# Patient Record
Sex: Female | Born: 1990 | Race: Black or African American | Hispanic: No | Marital: Single | Smoking: Never smoker
Health system: Southern US, Community
[De-identification: ages and names within clinical notes are randomized; demographics above are authoritative.]

## PROBLEM LIST (undated history)

## (undated) DIAGNOSIS — I10 Essential (primary) hypertension: Secondary | ICD-10-CM

## (undated) DIAGNOSIS — Z789 Other specified health status: Secondary | ICD-10-CM

## (undated) HISTORY — PX: WISDOM TOOTH EXTRACTION: SHX21

## (undated) HISTORY — DX: Essential (primary) hypertension: I10

---

## 2015-01-05 ENCOUNTER — Ambulatory Visit (INDEPENDENT_AMBULATORY_CARE_PROVIDER_SITE_OTHER): Payer: BLUE CROSS/BLUE SHIELD | Admitting: Internal Medicine

## 2015-01-05 VITALS — BP 130/82 | HR 110 | Temp 98.6°F | Resp 18 | Ht 68.5 in | Wt 344.0 lb

## 2015-01-05 DIAGNOSIS — Z6841 Body Mass Index (BMI) 40.0 and over, adult: Secondary | ICD-10-CM | POA: Insufficient documentation

## 2015-01-05 DIAGNOSIS — J029 Acute pharyngitis, unspecified: Secondary | ICD-10-CM | POA: Diagnosis not present

## 2015-01-05 DIAGNOSIS — J039 Acute tonsillitis, unspecified: Secondary | ICD-10-CM | POA: Diagnosis not present

## 2015-01-05 LAB — POCT RAPID STREP A (OFFICE): Rapid Strep A Screen: NEGATIVE

## 2015-01-05 MED ORDER — AMOXICILLIN 500 MG PO CAPS: 1000.0000 mg | ORAL_CAPSULE | Freq: Two times a day (BID) | ORAL | Status: AC

## 2015-01-05 MED ORDER — PREDNISONE 20 MG PO TABS: ORAL_TABLET | ORAL | Status: DC

## 2015-01-05 NOTE — Progress Notes (Signed)
   Subjective:  This chart was scribed for Susan Sia, MD by [facility], medical scribe at [facility].The patient was seen in exam room 10 and the patient's care was started at 10:46 AM.   Patient ID: Susan Sellers, female    DOB: Dec 29, 1990, 24 y.o.   MRN: [id number] Chief Complaint  Patient presents with  . Sore Throat    x1 day  . Adenopathy    rt side    HPI HPI Comments: Susan Sellers is a 24 y.o. female who presents to [facility] complaining of sore throat for the past two days, worse yesterday. Swollen right sided lymph node. Associated left ear pain and post nasal drip. She does not have a cough, fever, or chills. No history of mono. Works call ctr History reviewed. No pertinent past medical history. Prior to Admission medications   Not on File   No Known Allergies  Review of Systems  Constitutional: Negative for fever and chills.  HENT: Positive for ear pain, postnasal drip and sore throat.   Respiratory: Negative for cough.       Objective:  BP 130/82 mmHg  Pulse 110  Temp(Src) 98.6 F (37 C) (Oral)  Resp 18  Ht 5' 8.5" (1.74 m)  Wt 344 lb (156.037 kg)  BMI 51.54 kg/m2  SpO2 98%  LMP 11/19/2014 Physical Exam  Constitutional: She is oriented to person, place, and time. She appears well-developed and well-nourished. No distress.  Voice muffled mildly  HENT:  Head: Normocephalic and atraumatic.  Right Ear: External ear normal.  Left Ear: External ear normal.  Tonsils 3+ and covered w/ exudate R ac node 1+ and tender  Eyes: Pupils are equal, round, and reactive to light.  Neck: Normal range of motion.  Cardiovascular: Normal rate, regular rhythm and normal heart sounds.   Pulmonary/Chest: Effort normal and breath sounds normal. No respiratory distress. She has no wheezes.  Musculoskeletal: Normal range of motion.  Neurological: She is alert and oriented to person, place, and time.  Skin: Skin is warm and  dry.  Psychiatric: She has a normal mood and affect. Her behavior is normal.  Nursing note and vitals reviewed.  RS neg    Assessment & Plan:  Acute tonsillitis  Sore throat - Plan: POCT rapid strep A, Culture, Group A Strep  Meds ordered this encounter  Medications  . amoxicillin (AMOXIL) 500 MG capsule    Sig: Take 2 capsules (1,000 mg total) by mouth 2 (two) times daily.    Dispense:  40 capsule    Refill:  0  . predniSONE (DELTASONE) 20 MG tablet    Sig: 3/3/2/2/1/1 single daily dose for 6 days    Dispense:  12 tablet    Refill:  0   Treat as an early peritonsillar abscess and follow closely OOW 2 days   I have completed the patient encounter in its entirety as documented by the scribe, with editing by me where necessary. Hollyanne Schloesser P. Merla Riches, M.D.

## 2015-01-06 LAB — CULTURE, GROUP A STREP: ORGANISM ID, BACTERIA: NORMAL

## 2015-01-10 ENCOUNTER — Encounter: Payer: Self-pay | Admitting: Family Medicine

## 2015-01-12 ENCOUNTER — Telehealth: Payer: Self-pay | Admitting: Family Medicine

## 2015-01-12 NOTE — Telephone Encounter (Signed)
Spoke with patient about lab results she doing much better she start feeling better as soon as she took medicine

## 2019-11-12 ENCOUNTER — Inpatient Hospital Stay (HOSPITAL_COMMUNITY): Payer: BC Managed Care – PPO

## 2019-11-12 ENCOUNTER — Other Ambulatory Visit: Payer: Self-pay

## 2019-11-12 ENCOUNTER — Encounter (HOSPITAL_COMMUNITY): Payer: Self-pay | Admitting: Obstetrics and Gynecology

## 2019-11-12 ENCOUNTER — Inpatient Hospital Stay (HOSPITAL_COMMUNITY)
Admission: AD | Admit: 2019-11-12 | Discharge: 2019-11-12 | Disposition: A | Discharge status: Home / Self Care | Payer: BC Managed Care – PPO | Attending: Obstetrics and Gynecology | Admitting: Obstetrics and Gynecology

## 2019-11-12 DIAGNOSIS — O3680X0 Pregnancy with inconclusive fetal viability, not applicable or unspecified: Secondary | ICD-10-CM | POA: Insufficient documentation

## 2019-11-12 DIAGNOSIS — O4691 Antepartum hemorrhage, unspecified, first trimester: Secondary | ICD-10-CM

## 2019-11-12 DIAGNOSIS — O209 Hemorrhage in early pregnancy, unspecified: Secondary | ICD-10-CM | POA: Insufficient documentation

## 2019-11-12 DIAGNOSIS — Z3A01 Less than 8 weeks gestation of pregnancy: Secondary | ICD-10-CM

## 2019-11-12 DIAGNOSIS — O26891 Other specified pregnancy related conditions, first trimester: Secondary | ICD-10-CM | POA: Diagnosis present

## 2019-11-12 DIAGNOSIS — R109 Unspecified abdominal pain: Secondary | ICD-10-CM | POA: Diagnosis not present

## 2019-11-12 DIAGNOSIS — Z6790 Unspecified blood type, Rh positive: Secondary | ICD-10-CM | POA: Insufficient documentation

## 2019-11-12 DIAGNOSIS — O4690 Antepartum hemorrhage, unspecified, unspecified trimester: Secondary | ICD-10-CM

## 2019-11-12 LAB — POCT PREGNANCY, URINE: Preg Test, Ur: POSITIVE — AB

## 2019-11-12 LAB — WET PREP, GENITAL
Clue Cells Wet Prep HPF POC: NONE SEEN
Sperm: NONE SEEN
Trich, Wet Prep: NONE SEEN
Yeast Wet Prep HPF POC: NONE SEEN

## 2019-11-12 LAB — CBC
HCT: 37 % (ref 36.0–46.0)
Hemoglobin: 11.4 g/dL — ABNORMAL LOW (ref 12.0–15.0)
MCH: 25.7 pg — ABNORMAL LOW (ref 26.0–34.0)
MCHC: 30.8 g/dL (ref 30.0–36.0)
MCV: 83.3 fL (ref 80.0–100.0)
Platelets: 300 10*3/uL (ref 150–400)
RBC: 4.44 MIL/uL (ref 3.87–5.11)
RDW: 14.5 % (ref 11.5–15.5)
WBC: 12.3 10*3/uL — ABNORMAL HIGH (ref 4.0–10.5)
nRBC: 0 % (ref 0.0–0.2)

## 2019-11-12 LAB — HCG, QUANTITATIVE, PREGNANCY: hCG, Beta Chain, Quant, S: 1160 m[IU]/mL — ABNORMAL HIGH (ref <5)

## 2019-11-12 LAB — ABO/RH: ABO/RH(D): B POS

## 2019-11-12 NOTE — MAU Provider Note (Signed)
History     CSN: [id number]  Arrival date and time: 11/12/19 1050   First Provider Initiated Contact with Patient 11/12/19 1242      Chief Complaint  Patient presents with  . Vaginal Bleeding  . Abdominal Pain   29 y.o. G1 @[redacted]w[redacted]d  by LMP presenting with VB. Bleeding started yesterday. She soaked 1 pad and passed a large clot with tissue then bleeding tapered and has been minimal. Cramping is mild today. She has not had this pregnancy.   OB History    Gravida  1   Para  0   Term  0   Preterm  0   AB  0   Living  0     SAB  0   TAB  0   Ectopic  0   Multiple  0   Live Births  0           History reviewed. No pertinent past medical history.  History reviewed. No pertinent surgical history.  Family History  Problem Relation Age of Onset  . Heart disease Mother   . Hypertension Mother   . Hypertension Father   . Hypertension Maternal Grandmother   . Hyperlipidemia Paternal Grandmother     Social History   Tobacco Use  . Smoking status: Never Smoker  . Smokeless tobacco: Never Used  Substance Use Topics  . Alcohol use: Not Currently    Alcohol/week: 0.0 standard drinks  . Drug use: Not Currently    Types: Marijuana    Comment: last used in early 2021    Allergies: No Known Allergies  Medications Prior to Admission  Medication Sig Dispense Refill Last Dose  . Prenatal Vit-Fe Fumarate-FA (MULTIVITAMIN-PRENATAL) 27-0.8 MG TABS tablet Take 1 tablet by mouth daily at 12 noon.     . predniSONE (DELTASONE) 20 MG tablet 3/3/2/2/1/1 single daily dose for 6 days 12 tablet 0     Review of Systems  Gastrointestinal: Positive for abdominal pain.  Genitourinary: Positive for vaginal bleeding.   Physical Exam   Blood pressure 140/86, pulse 82, temperature 98.3 F (36.8 C), temperature source Oral, resp. rate 17, height 5\' 8"  (1.727 m), weight 132 kg, last menstrual period 09/30/2019, SpO2 100 %.  Physical Exam Vitals and nursing note reviewed.  Exam conducted with a chaperone present.  Constitutional:      General: She is not in acute distress.    Appearance: She is well-developed.  HENT:     Head: Normocephalic and atraumatic.  Cardiovascular:     Rate and Rhythm: Normal rate.  Pulmonary:     Effort: Pulmonary effort is normal. No respiratory distress.  Abdominal:     General: There is no distension.     Palpations: Abdomen is soft.     Tenderness: There is no abdominal tenderness.  Genitourinary:    Comments: External: no lesions or erythema Vagina: rugated, pink, moist, ?POCs protruding from os, removed easily with ring forceps, scant blood in vault Cervix closed  Skin:    General: Skin is warm and dry.  Neurological:     General: No focal deficit present.     Mental Status: She is alert and oriented to person, place, and time.  Psychiatric:        Mood and Affect: Mood normal.    Results for orders placed or performed during the hospital encounter of 11/12/19 (from the past 24 hour(s))  Pregnancy, urine POC     Status: Abnormal   Collection Time: 11/12/19 11:43  AM  Result Value Ref Range   Preg Test, Ur POSITIVE (A) NEGATIVE  ABO/Rh     Status: None   Collection Time: 11/12/19 12:32 PM  Result Value Ref Range   ABO/RH(D) B POS    No rh immune globuloin      NOT A RH IMMUNE GLOBULIN CANDIDATE, PT RH POSITIVE Performed at [facility] [address] [place], [place] [address]   CBC     Status: Abnormal   Collection Time: 11/12/19 12:32 PM  Result Value Ref Range   WBC 12.3 (H) 4.0 - 10.5 K/uL   RBC 4.44 3.87 - 5.11 MIL/uL   Hemoglobin 11.4 (L) 12.0 - 15.0 g/dL   HCT 37.0 36 - 46 %   MCV 83.3 80.0 - 100.0 fL   MCH 25.7 (L) 26.0 - 34.0 pg   MCHC 30.8 30.0 - 36.0 g/dL   RDW 14.5 11.5 - 15.5 %   Platelets 300 150 - 400 K/uL   nRBC 0.0 0.0 - 0.2 %  hCG, quantitative, pregnancy     Status: Abnormal   Collection Time: 11/12/19 12:32 PM  Result Value Ref Range   hCG, Beta Chain, Quant, S 1,160 (H)  <5 mIU/mL  Wet prep, genital     Status: Abnormal   Collection Time: 11/12/19 12:55 PM   Specimen: PATH Cytology Cervicovaginal Ancillary Only  Result Value Ref Range   Yeast Wet Prep HPF POC NONE SEEN NONE SEEN   Trich, Wet Prep NONE SEEN NONE SEEN   Clue Cells Wet Prep HPF POC NONE SEEN NONE SEEN   WBC, Wet Prep HPF POC MANY (A) NONE SEEN   Sperm NONE SEEN    US OB LESS THAN 14 WEEKS WITH OB TRANSVAGINAL  Result Date: 11/12/2019 CLINICAL DATA:  Positive urine pregnancy test. Vaginal bleeding. Estimated gestational age by last menstrual period equals 6 weeks 1 day EXAM: OBSTETRIC <14 WK [place] AND TRANSVAGINAL OB US TECHNIQUE: Both transabdominal and transvaginal ultrasound examinations were performed for complete evaluation of the gestation as well as the maternal uterus, adnexal regions, and pelvic cul-de-sac. Transvaginal technique was performed to assess early pregnancy. COMPARISON:  None. FINDINGS: Intrauterine gestational sac: Absent Yolk sac:  Absent Embryo:  Absent Subchorionic hemorrhage:  None visualized. Maternal uterus/adnexae: The thick-walled cystic structure measuring 2 cm within the RIGHT ovary most consistent with corpus luteal cyst. Normal LEFT ovary. Endometrium is thickened. No free fluid. IMPRESSION: Probable early intrauterine gestational sac, but no yolk sac, fetal pole, or cardiac activity yet visualized. Recommend follow-up quantitative B-HCG levels and follow-up US in 14 days to assess viability. This recommendation follows SRU consensus guidelines: Diagnostic Criteria for Nonviable Pregnancy Early in the First Trimester. [place] Med 2013; 993:7169-67. Electronically Signed   By: Suzy Bouchard M.D.   On: 11/12/2019 14:11   MAU Course  Procedures  MDM Labs and [place] ordered and reviewed. No IUP seen on [place], suspect miscarriage but cannot r/o early pregnancy or ectopic pregnancy. Discussed findings with pt and mother. Recommend following qhcg in 2 days at office. Tissue sent  to path. Stable for discharge home.   Assessment and Plan   1. Pregnancy, location unknown   2. Vaginal bleeding in pregnancy   3. Blood type, Rh positive    Discharge home Follow up at [facility] in 2 days-office staff [place] notified Bleeding/ectopic precautions Pelvic rest  Allergies as of 11/12/2019   No Known Allergies     Medication List  STOP taking these medications   predniSONE 20 MG tablet Commonly known as: DELTASONE     TAKE these medications   multivitamin-prenatal 27-0.8 MG Tabs tablet Take 1 tablet by mouth daily at 12 noon.      Donette Larry, CNM 11/12/2019, 2:48 PM

## 2019-11-12 NOTE — Discharge Instructions (Signed)
Threatened Miscarriage  A threatened miscarriage is when you have bleeding from your vagina during the first 20 weeks of pregnancy but the pregnancy does not end. Your doctor will do tests to make sure you are still pregnant. The cause of the bleeding may not be known. This condition does not mean your pregnancy will end, but it does increase the risk that it will end (complete miscarriage). Follow these instructions at home:  Get plenty of rest.  If you have bleeding in your vagina, do not have sex or use tampons.  Do not douche.  Do not smoke or use drugs.  Do not drink alcohol.  Avoid caffeine.  Keep all follow-up prenatal visits as told by your doctor. This is important. Contact a doctor if:  You have light bleeding from your vagina.  You have belly pain or cramping.  You have a fever. Get help right away if:  You have heavy bleeding from your vagina.  You have clots of blood coming from your vagina.  You pass tissue from your vagina.  You have a gush of fluid from your vagina.  You are leaking fluid from your vagina.  You have very bad pain or cramps in your low back or belly.  You have fever, chills, and very bad belly pain. Summary  A threatened miscarriage is when you have bleeding from your vagina during the first 20 weeks of pregnancy but the pregnancy does not end.  This condition does not mean your pregnancy will end, but it does increase the risk that it will end (complete miscarriage).  Get plenty of rest. If you have bleeding in your vagina, do not have sex or use tampons.  Keep all follow-up prenatal visits as told by your doctor. This is important. This information is not intended to replace advice given to you by your health care provider. Make sure you discuss any questions you have with your health care provider. Document Revised: 06/09/2017 Document Reviewed: 07/30/2016 Elsevier Patient Education  2020 Elsevier Inc.  

## 2019-11-12 NOTE — MAU Note (Signed)
Was 6 wks on Sat. Has appt for 7/6.  Had some blood and tissue yesterday, still bleeding a little, not as heavy as yesterday. Some discomfort, little cramping.  3 +HPT, the last one had a fainter line.

## 2019-11-13 LAB — GC/CHLAMYDIA PROBE AMP (~~LOC~~) NOT AT ARMC
Chlamydia: NEGATIVE
Comment: NEGATIVE
Comment: NORMAL
Neisseria Gonorrhea: NEGATIVE

## 2019-11-14 LAB — SURGICAL PATHOLOGY

## 2019-11-20 DIAGNOSIS — O3680X0 Pregnancy with inconclusive fetal viability, not applicable or unspecified: Secondary | ICD-10-CM | POA: Diagnosis not present

## 2019-11-20 DIAGNOSIS — O021 Missed abortion: Secondary | ICD-10-CM | POA: Diagnosis not present

## 2019-11-29 DIAGNOSIS — O021 Missed abortion: Secondary | ICD-10-CM | POA: Diagnosis not present

## 2020-10-01 DIAGNOSIS — K648 Other hemorrhoids: Secondary | ICD-10-CM | POA: Diagnosis not present

## 2020-10-27 ENCOUNTER — Inpatient Hospital Stay (HOSPITAL_COMMUNITY)
Admission: AD | Admit: 2020-10-27 | Discharge: 2020-10-27 | Disposition: A | Discharge status: Home / Self Care | Payer: BC Managed Care – PPO | Attending: Obstetrics & Gynecology | Admitting: Obstetrics & Gynecology

## 2020-10-27 ENCOUNTER — Telehealth: Payer: Self-pay | Admitting: Women's Health

## 2020-10-27 ENCOUNTER — Other Ambulatory Visit: Payer: Self-pay

## 2020-10-27 DIAGNOSIS — Z3202 Encounter for pregnancy test, result negative: Secondary | ICD-10-CM | POA: Diagnosis not present

## 2020-10-27 DIAGNOSIS — Z6790 Unspecified blood type, Rh positive: Secondary | ICD-10-CM

## 2020-10-27 LAB — HCG, QUANTITATIVE, PREGNANCY: hCG, Beta Chain, Quant, S: 8 m[IU]/mL — ABNORMAL HIGH (ref <5)

## 2020-10-27 LAB — POCT PREGNANCY, URINE: Preg Test, Ur: NEGATIVE

## 2020-10-27 NOTE — Discharge Instructions (Signed)
Prenatal Care Providers           [facility] - accepts patients without insurance  Phone: [phone]  [facility] @ [facility]   Phone: [phone]  [facility] @[facility]       Phone: [phone]            [facility] @      Phone: [phone]          [facility] @ [place]   Phone: [phone]  [facility] @ [facility] - accepts patients without insurance  Phone: [phone]  [facility] @ [facility] Phone: [phone]     [facility] - accepts patients without insurance Phone: [phone]  [facility]  Phone: [phone]  [facility] Phone: [phone]  [facility] Phone: [phone]  [facility] Phone: [phone]  [facility] Phone: [phone]  [facility]  Phone: [phone]         

## 2020-10-27 NOTE — MAU Note (Signed)
Having some spotting, started yesterday.  Mainly when she wipes.Believes she is 5 wks.  Hx of miscarriage, very tearful and scared.  Denies pain.  Preg has not been confirmed, +HPT x2 6/6.

## 2020-10-27 NOTE — Telephone Encounter (Signed)
Phone rang, no answer, went to VM, VM full and unable to leave messages. Will send MyChart message.  Marylen Ponto, NP  4:07 PM 10/27/2020

## 2020-10-27 NOTE — MAU Provider Note (Signed)
Event Date/Time   First Provider Initiated Contact with Patient 10/27/20 1400      S Ms. Susan Sellers is a 30 y.o. G1P0000 patient who presents to MAU today with complaint of +UPT at home x2. Patient endorses some mild vaginal bleeding with a small clot this morning and reports her period is late. Patient reports she feels well otherwise.  O BP (!) 147/86 (BP Location: Right Arm)   Pulse 70   Temp 98.6 F (37 C) (Oral)   Resp 18   Ht 5\' 8"  (1.727 m)   Wt 127.1 kg   LMP 09/18/2020   SpO2 98%   BMI 42.59 kg/m   Physical Exam Vitals and nursing note reviewed.  Constitutional:      General: She is not in acute distress.    Appearance: Normal appearance. She is not ill-appearing, toxic-appearing or diaphoretic.  HENT:     Head: Normocephalic and atraumatic.  Pulmonary:     Effort: Pulmonary effort is normal.  Neurological:     Mental Status: She is alert and oriented to person, place, and time.  Psychiatric:        Mood and Affect: Mood normal.        Behavior: Behavior normal.        Thought Content: Thought content normal.        Judgment: Judgment normal.   A Medical screening exam complete UPT negative hCG drawn, will call pt with results  P Discharge from MAU in stable condition Patient given the option of transfer to [facility] for further evaluation or seek care in outpatient facility of choice List of options for follow-up given in MyChart Pt to follow-up with PCP re: elevated BP Warning signs for worsening condition that would warrant emergency follow-up discussed Patient may return to MAU as needed   Jaxtyn Linville, [facility], NP 10/27/2020 2:01 PM

## 2020-10-29 ENCOUNTER — Encounter: Payer: Self-pay | Admitting: *Deleted

## 2020-10-29 ENCOUNTER — Other Ambulatory Visit (INDEPENDENT_AMBULATORY_CARE_PROVIDER_SITE_OTHER): Payer: BC Managed Care – PPO | Admitting: *Deleted

## 2020-10-29 ENCOUNTER — Other Ambulatory Visit: Payer: Self-pay

## 2020-10-29 ENCOUNTER — Telehealth: Payer: Self-pay | Admitting: *Deleted

## 2020-10-29 DIAGNOSIS — O4690 Antepartum hemorrhage, unspecified, unspecified trimester: Secondary | ICD-10-CM | POA: Diagnosis not present

## 2020-10-29 LAB — BETA HCG QUANT (REF LAB): hCG Quant: 2 m[IU]/mL

## 2020-10-29 NOTE — Progress Notes (Signed)
Pt is in office for Stat HCG today.  Pt was seen in MAU on 6/13 for vaginal bleeding.    States that she passed what seem to be a sac. Pt states she is still having some light bleeding but better than before.  Pt denies any pain/cramping at this time.   Pt taken to lab for Stat Hcg draw.  Advised she will be contacted by end of today with results.   Pt has no other concerns/questions today.

## 2020-10-29 NOTE — Progress Notes (Signed)
Agree with A & P. 

## 2020-10-29 NOTE — Telephone Encounter (Signed)
Attempt to contact pt regarding Hcg results from today's visit.  Lab results for Hcg is 2, per Dr Alysia Penna- no other testing needed. Follow up as needed with primary OB.  No answer, unable to leave message.  Will send mychart msg to pt.

## 2021-01-22 DIAGNOSIS — Z3A08 8 weeks gestation of pregnancy: Secondary | ICD-10-CM | POA: Diagnosis not present

## 2021-01-22 DIAGNOSIS — Z3490 Encounter for supervision of normal pregnancy, unspecified, unspecified trimester: Secondary | ICD-10-CM | POA: Diagnosis not present

## 2021-01-22 DIAGNOSIS — Z3480 Encounter for supervision of other normal pregnancy, unspecified trimester: Secondary | ICD-10-CM | POA: Diagnosis not present

## 2021-01-22 DIAGNOSIS — Z3201 Encounter for pregnancy test, result positive: Secondary | ICD-10-CM | POA: Diagnosis not present

## 2021-01-22 DIAGNOSIS — O09291 Supervision of pregnancy with other poor reproductive or obstetric history, first trimester: Secondary | ICD-10-CM | POA: Diagnosis not present

## 2021-01-22 DIAGNOSIS — O26899 Other specified pregnancy related conditions, unspecified trimester: Secondary | ICD-10-CM | POA: Diagnosis not present

## 2021-02-02 DIAGNOSIS — R87612 Low grade squamous intraepithelial lesion on cytologic smear of cervix (LGSIL): Secondary | ICD-10-CM | POA: Diagnosis not present

## 2021-02-02 DIAGNOSIS — Z3481 Encounter for supervision of other normal pregnancy, first trimester: Secondary | ICD-10-CM | POA: Diagnosis not present

## 2021-02-02 DIAGNOSIS — Z3482 Encounter for supervision of other normal pregnancy, second trimester: Secondary | ICD-10-CM | POA: Diagnosis not present

## 2021-02-02 DIAGNOSIS — Z3490 Encounter for supervision of normal pregnancy, unspecified, unspecified trimester: Secondary | ICD-10-CM | POA: Diagnosis not present

## 2021-02-02 DIAGNOSIS — O039 Complete or unspecified spontaneous abortion without complication: Secondary | ICD-10-CM | POA: Diagnosis not present

## 2021-02-02 DIAGNOSIS — Z3A15 15 weeks gestation of pregnancy: Secondary | ICD-10-CM | POA: Diagnosis not present

## 2021-02-09 DIAGNOSIS — O99211 Obesity complicating pregnancy, first trimester: Secondary | ICD-10-CM | POA: Diagnosis not present

## 2021-03-09 DIAGNOSIS — O039 Complete or unspecified spontaneous abortion without complication: Secondary | ICD-10-CM | POA: Diagnosis not present

## 2021-03-09 DIAGNOSIS — Z3A15 15 weeks gestation of pregnancy: Secondary | ICD-10-CM | POA: Diagnosis not present

## 2021-03-09 DIAGNOSIS — Z3482 Encounter for supervision of other normal pregnancy, second trimester: Secondary | ICD-10-CM | POA: Diagnosis not present

## 2021-03-17 DIAGNOSIS — R87612 Low grade squamous intraepithelial lesion on cytologic smear of cervix (LGSIL): Secondary | ICD-10-CM | POA: Diagnosis not present

## 2021-04-03 ENCOUNTER — Encounter (HOSPITAL_COMMUNITY): Payer: Self-pay | Admitting: Obstetrics and Gynecology

## 2021-04-03 ENCOUNTER — Observation Stay (HOSPITAL_COMMUNITY)
Admission: AD | Admit: 2021-04-03 | Discharge: 2021-04-04 | Disposition: A | Discharge status: Home / Self Care | Payer: BC Managed Care – PPO | Attending: Obstetrics & Gynecology | Admitting: Obstetrics & Gynecology

## 2021-04-03 ENCOUNTER — Other Ambulatory Visit: Payer: Self-pay

## 2021-04-03 ENCOUNTER — Inpatient Hospital Stay (HOSPITAL_BASED_OUTPATIENT_CLINIC_OR_DEPARTMENT_OTHER): Payer: BC Managed Care – PPO

## 2021-04-03 DIAGNOSIS — O99212 Obesity complicating pregnancy, second trimester: Secondary | ICD-10-CM | POA: Diagnosis not present

## 2021-04-03 DIAGNOSIS — O09299 Supervision of pregnancy with other poor reproductive or obstetric history, unspecified trimester: Secondary | ICD-10-CM | POA: Diagnosis present

## 2021-04-03 DIAGNOSIS — O4692 Antepartum hemorrhage, unspecified, second trimester: Secondary | ICD-10-CM

## 2021-04-03 DIAGNOSIS — O42919 Preterm premature rupture of membranes, unspecified as to length of time between rupture and onset of labor, unspecified trimester: Secondary | ICD-10-CM

## 2021-04-03 DIAGNOSIS — O42912 Preterm premature rupture of membranes, unspecified as to length of time between rupture and onset of labor, second trimester: Secondary | ICD-10-CM | POA: Insufficient documentation

## 2021-04-03 DIAGNOSIS — Z20822 Contact with and (suspected) exposure to covid-19: Secondary | ICD-10-CM | POA: Insufficient documentation

## 2021-04-03 DIAGNOSIS — O4102X0 Oligohydramnios, second trimester, not applicable or unspecified: Secondary | ICD-10-CM

## 2021-04-03 DIAGNOSIS — Z3A18 18 weeks gestation of pregnancy: Secondary | ICD-10-CM

## 2021-04-03 HISTORY — DX: Other specified health status: Z78.9

## 2021-04-03 LAB — RESP PANEL BY RT-PCR (FLU A&B, COVID) ARPGX2
Influenza A by PCR: NEGATIVE
Influenza B by PCR: NEGATIVE
SARS Coronavirus 2 by RT PCR: NEGATIVE

## 2021-04-03 MED ORDER — ZOLPIDEM TARTRATE 5 MG PO TABS: 5.0000 mg | ORAL_TABLET | Freq: Every evening | ORAL | Status: DC | PRN

## 2021-04-03 MED ORDER — PRENATAL MULTIVITAMIN CH
1.0000 | ORAL_TABLET | Freq: Every day | ORAL | Status: DC
  Administered 2021-04-03 – 2021-04-04 (×2): 1 via ORAL
  Filled 2021-04-03 (×2): qty 1

## 2021-04-03 MED ORDER — LACTATED RINGERS IV SOLN: INTRAVENOUS | Status: DC

## 2021-04-03 MED ORDER — DOCUSATE SODIUM 100 MG PO CAPS
100.0000 mg | ORAL_CAPSULE | Freq: Every day | ORAL | Status: DC
  Filled 2021-04-03: qty 1

## 2021-04-03 MED ORDER — ACETAMINOPHEN 325 MG PO TABS: 650.0000 mg | ORAL_TABLET | ORAL | Status: DC | PRN

## 2021-04-03 MED ORDER — CALCIUM CARBONATE ANTACID 500 MG PO CHEW: 2.0000 | CHEWABLE_TABLET | ORAL | Status: DC | PRN

## 2021-04-03 NOTE — MAU Provider Note (Signed)
History     CSN: [id number]  Arrival date and time: 04/03/21 1558   Event Date/Time   First Provider Initiated Contact with Patient 04/03/21 1709      Chief Complaint  Patient presents with   Rupture of Membranes   HPI Susan Sellers is a 30 y.o. G3P0020 at [redacted]w[redacted]d who presents for possible rupture of membranes. Incident occurred around 330 pm today. States she was sitting on the toilet and noticed a translucent sac bulging from her vagina, states it burst & liquid leaked out of it into the toilet. Since then has had dark red spotting on toilet paper. Denies fever, abdominal pain, dysuria, or recent exams or intercourse. Sees Dr. Richardson Dopp for prenatal care.   OB History     Gravida  3   Para  0   Term  0   Preterm  0   AB  2   Living  0      SAB  2   IAB  0   Ectopic  0   Multiple  0   Live Births  0           Past Medical History:  Diagnosis Date   Medical history non-contributory     Past Surgical History:  Procedure Laterality Date   WISDOM TOOTH EXTRACTION      Family History  Problem Relation Age of Onset   Heart disease Mother    Hypertension Mother    Hypertension Father    Hypertension Maternal Grandmother    Hyperlipidemia Paternal Grandmother     Social History   Tobacco Use   Smoking status: Never   Smokeless tobacco: Never  Substance Use Topics   Alcohol use: Not Currently    Alcohol/week: 0.0 standard drinks   Drug use: Not Currently    Types: Marijuana    Comment: last used in early 2021    Allergies: No Known Allergies  Medications Prior to Admission  Medication Sig Dispense Refill Last Dose   Prenatal Vit-Fe Fumarate-FA (MULTIVITAMIN-PRENATAL) 27-0.8 MG TABS tablet Take 1 tablet by mouth daily at 12 noon.   04/02/2021    Review of Systems  Constitutional: Negative.   Gastrointestinal: Negative.   Genitourinary:  Positive for vaginal bleeding and vaginal discharge. Negative for dysuria.  Physical Exam   Blood  pressure (!) 136/93, pulse 96, temperature 98.7 F (37.1 C), temperature source Oral, resp. rate 19, height 5\' 8"  (1.727 m), weight 124.4 kg, last menstrual period 09/18/2020, SpO2 100 %, unknown if currently breastfeeding.  Physical Exam Vitals and nursing note reviewed. Exam conducted with a chaperone present.  Constitutional:      Appearance: Normal appearance. She is not ill-appearing or toxic-appearing.  HENT:     Head: Normocephalic and atraumatic.  Eyes:     General: No scleral icterus. Pulmonary:     Effort: Pulmonary effort is normal. No respiratory distress.  Abdominal:     Palpations: Abdomen is soft.     Tenderness: There is no abdominal tenderness.  Genitourinary:    Comments: Sterile Speculum Exam: NEFG, unable to visualize cervix, small amount of dark red mucoid blood in vagina.   Sterile digital exam: 1/thick/-3 Skin:    General: Skin is warm and dry.  Neurological:     General: No focal deficit present.     Mental Status: She is alert.  Psychiatric:        Mood and Affect: Mood normal.        Behavior: Behavior normal.  MAU Course  Procedures No results found for this or any previous visit (from the past 24 hour(s)). No results found.  MDM Patient presents with possible PPROM. Unable to collect sample for amnisure nor fern due to viscous blood on exam & no fluid.  Cervix is dilated 1 cm. Limited OB ultrasound ordered & shows anhydramnios.  Dr. Mora Appl notified of results & will admit patient to [facility] unit.   Assessment and Plan   1. Anhydramnios in second trimester, single or unspecified fetus   2. [redacted] weeks gestation of pregnancy   3. Vaginal bleeding in pregnancy, second trimester   4. Preterm premature rupture of membranes (PPROM) with unknown onset of labor    -patient admitted to [facility] unit per Dr. Mora Appl -[facility] CNM on unit to speak with patient about plan  [name] 04/03/2021, 5:09 PM

## 2021-04-03 NOTE — H&P (Signed)
OB ADMISSION/ HISTORY & PHYSICAL:  Admission Date: 04/03/2021  3:58 PM  Admit Diagnosis: PPROM @ 18.1 wks  Susan Sellers is a 30 y.o. female G3P0020 [redacted]w[redacted]d presenting for spontaneous rupture of membranes.  Noticed a sac when having a BM that ruptured in the toilet. After rupture noticed spotting.   History of current pregnancy: G3P0020   Primary OB Provider: Richardson Dopp Patient entered care with [facility] at 9.4 wks.   EDC 09/03/20 by LMP 11/27/20 and congruent w/ 9 wk U/S.     Significant prenatal events:   2 SABs Patient Active Problem List   Diagnosis Date Noted   Preterm premature rupture of membranes 04/03/2021   BMI 50.0-59.9, adult--morbid obesity 01/05/2015    Prenatal Labs: ABO, Rh:  B positive Antibody:   Rubella:   Immune RPR:   NR HBsAg:   NR HIV:   NR GTT: GBS:   GC/CHL: Negative/Negative Genetics:   Tdap/influenza vaccines:     OB History  Gravida Para Term Preterm AB Living  3 0 0 0 2 0  SAB IAB Ectopic Multiple Live Births  2 0 0 0 0    # Outcome Date GA Lbr Len/2nd Weight Sex Delivery Anes PTL Lv  3 Current           2 SAB           1 SAB             Medical / Surgical History: Past medical history:  Past Medical History:  Diagnosis Date   Medical history non-contributory     Past surgical history:  Past Surgical History:  Procedure Laterality Date   WISDOM TOOTH EXTRACTION     Family History:  Family History  Problem Relation Age of Onset   Heart disease Mother    Hypertension Mother    Hypertension Father    Hypertension Maternal Grandmother    Hyperlipidemia Paternal Grandmother     Social History:  reports that she has never smoked. She has never used smokeless tobacco. She reports that she does not currently use alcohol. She reports that she does not currently use drugs after having used the following drugs: Marijuana.  Allergies: Patient has no known allergies.   Current Medications at time of admission:  Prior to Admission  medications   Medication Sig Start Date End Date Taking? Authorizing Provider  Prenatal Vit-Fe Fumarate-FA (MULTIVITAMIN-PRENATAL) 27-0.8 MG TABS tablet Take 1 tablet by mouth daily at 12 noon.   Yes [provider]    Review of Systems: Constitutional: Negative   HENT: Negative   Eyes: Negative   Respiratory: Negative   Cardiovascular: Negative   Gastrointestinal: Negative  Genitourinary: negative for bloody show, positive for LOF   Musculoskeletal: Negative   Skin: Negative   Neurological: Negative   Endo/Heme/Allergies: Negative   Psychiatric/Behavioral: Negative    Physical Exam: VS: Blood pressure (!) 136/93, pulse 96, temperature 98.7 F (37.1 C), temperature source Oral, resp. rate 19, height 5\' 8"  (1.727 m), weight 124.4 kg, last menstrual period 09/18/2020, SpO2 100 %, unknown if currently breastfeeding. AAO x3, no signs of distress Cardiovascular: RRR Respiratory: Lung fields clear to ausculation GU/GI: Abdomen gravid, non-tender, non-distended Extremities: negative edema, negative for pain, tenderness, and cords  Cervical exam: deferred  FHR doppler: 151   Assessment: 30 y.o. G3P0020 [redacted]w[redacted]d admitted to [facility] for PPROM. Small amount of spotting noted after rupture. U/S done in MAU. Cervical length 3cm, breech, and anhydramnios. Long discussion about POC with pt and  significant other. Pt requesting no IOL until necessary, such as infection or fetal demise. Pt aware of poor fetal outcome with anhydramnios.   Plan:  Admit to [facility] Routine antenatal admission orders FHT q shift LR @ 116mls/hr Regular diet  Collaborated care with Dr Mora Appl.   Carollee Leitz MSN, CNM 04/03/2021 6:56 PM

## 2021-04-03 NOTE — MAU Note (Signed)
Presents stating while attempting to have a BM she noted a translucent sac protrude from her vaginal opening, stating the sac burst and fluid came out into the toilet.  Reports after sac ruptured, noticed spotting afterwards.  States didn't pass a fetus, noticed tissue.

## 2021-04-04 NOTE — Discharge Summary (Signed)
ANTENATAL DISCHARGE SUMMARY  Patient ID: Susan Sellers MRN: [id number] DOB/AGE: [birth year] 28 y.o.  Admit date: 04/03/2021 Discharge date: 04/04/2021  Admission Diagnoses: Preterm premature rupture of membranes [O42.919]  Discharge Diagnoses: Preterm premature rupture of membranes [O42.919]         Discharged Condition: good  Hospital Course: Patient admitted to antepartum service; IV hydration given and dopplers done q shift.   Patient Active Problem List   Diagnosis Date Noted   Preterm premature rupture of membranes 04/03/2021   BMI 50.0-59.9, adult--morbid obesity 01/05/2015   Discharge Physical Exam: General appearance - alert, well appearing, and in no distress Mental status - normal mood, behavior, speech, dress, motor activity, and thought processes Abdomen - soft non tender  Ex SCDs FHTs  150s obtained via bedside US - seen on [place] anhydramios   Cervix: not evaluated  Consults: None  Treatments: IV hydration / expectant management  Disposition: home   Allergies as of 04/04/2021   No Known Allergies      Medication List     TAKE these medications    multivitamin-prenatal 27-0.8 MG Tabs tablet Take 1 tablet by mouth daily at 12 noon.         SignedEssie Hart, CNM 04/04/2021, 12:14 PM

## 2021-04-04 NOTE — Plan of Care (Signed)
  Problem: Clinical Measurements: Goal: Diagnostic test results will improve Outcome: Adequate for Discharge   Problem: Education: Goal: Knowledge of General Education information will improve Description: Including pain rating scale, medication(s)/side effects and non-pharmacologic comfort measures Outcome: Completed/Met   Problem: Health Behavior/Discharge Planning: Goal: Ability to manage health-related needs will improve Outcome: Completed/Met   Problem: Clinical Measurements: Goal: Ability to maintain clinical measurements within normal limits will improve Outcome: Completed/Met Goal: Cardiovascular complication will be avoided Outcome: Completed/Met   Problem: Activity: Goal: Risk for activity intolerance will decrease Outcome: Completed/Met   Problem: Nutrition: Goal: Adequate nutrition will be maintained Outcome: Completed/Met   Problem: Coping: Goal: Level of anxiety will decrease Outcome: Completed/Met   Problem: Elimination: Goal: Will not experience complications related to urinary retention Outcome: Completed/Met   Problem: Safety: Goal: Ability to remain free from injury will improve Outcome: Completed/Met   Problem: Skin Integrity: Goal: Risk for impaired skin integrity will decrease Outcome: Completed/Met   Problem: Education: Goal: Individualized Educational Video(s) Outcome: Completed/Met   Problem: Clinical Measurements: Goal: Complications related to the disease process, condition or treatment will be avoided or minimized Outcome: Completed/Met

## 2021-04-04 NOTE — Progress Notes (Signed)
Name: Susan Sellers Medical Record Number:  [id number] Date of Birth: 05-06-1991 Date of Service: 04/04/2021  30 y.o. G3P0020 [redacted]w[redacted]d HD#1 admitted for Preterm premature rupture of membranes [O42.919].  Pt currently stable with no complaints. She denies contractions, no vaginal bleeding, Reports FM.  The patient's past medical history and prenatal records were reviewed.  Additional issues addressed and updated today: Patient Active Problem List   Diagnosis Date Noted   Preterm premature rupture of membranes 04/03/2021   BMI 50.0-59.9, adult--morbid obesity 01/05/2015    Physical Examination:   Vitals:   04/04/21 0722 04/04/21 1127  BP: 130/64 129/71  Pulse: 77 76  Resp: 18 18  Temp: 97.7 F (36.5 C) 97.9 F (36.6 C)  SpO2: 100% 100%   General appearance - alert, well appearing, and in no distress Mental status - normal mood, behavior, speech, dress, motor activity, and thought processes Abdomen - soft non tender  Ex SCDs FHTs  150s obtained via bedside US - seen on [place] anhydramios   Cervix: not evaluated  Results for orders placed or performed during the hospital encounter of 04/03/21 (from the past 24 hour(s))  Resp Panel by RT-PCR (Flu A&B, Covid) Nasopharyngeal Swab     Status: None   Collection Time: 04/03/21  7:35 PM   Specimen: Nasopharyngeal Swab; Nasopharyngeal(NP) swabs in vial transport medium  Result Value Ref Range   SARS Coronavirus 2 by RT PCR NEGATIVE NEGATIVE   Influenza A by PCR NEGATIVE NEGATIVE   Influenza B by PCR NEGATIVE NEGATIVE  Type and screen [facility]     Status: None   Collection Time: 04/03/21  7:36 PM  Result Value Ref Range   ABO/RH(D) B POS    Antibody Screen NEG    Sample Expiration      04/06/2021,2359 Performed at [facility], [address], [place], [place] [address]     Assessment: HD#1  [redacted]w[redacted]d with PPROM.  Spent 30 minutes at bedside with patient and her spouse discussing diagnosis, and plan of  management options: expectant management in hospital, exp. Management at home, induction of labor.   Plan: Patient desires to be discharged home  Reviewed return precautions with patient     [name]

## 2021-04-05 ENCOUNTER — Encounter (HOSPITAL_COMMUNITY): Payer: Self-pay | Admitting: Obstetrics & Gynecology

## 2021-04-05 ENCOUNTER — Observation Stay (HOSPITAL_COMMUNITY): Payer: BC Managed Care – PPO

## 2021-04-05 ENCOUNTER — Observation Stay (HOSPITAL_COMMUNITY)
Admission: AD | Admit: 2021-04-05 | Discharge: 2021-04-06 | Disposition: A | Discharge status: Home / Self Care | Payer: BC Managed Care – PPO | Attending: Obstetrics and Gynecology | Admitting: Obstetrics and Gynecology

## 2021-04-05 ENCOUNTER — Other Ambulatory Visit: Payer: Self-pay

## 2021-04-05 DIAGNOSIS — Z20822 Contact with and (suspected) exposure to covid-19: Secondary | ICD-10-CM | POA: Diagnosis not present

## 2021-04-05 DIAGNOSIS — O42119 Preterm premature rupture of membranes, onset of labor more than 24 hours following rupture, unspecified trimester: Secondary | ICD-10-CM

## 2021-04-05 DIAGNOSIS — I82890 Acute embolism and thrombosis of other specified veins: Secondary | ICD-10-CM | POA: Diagnosis not present

## 2021-04-05 DIAGNOSIS — R102 Pelvic and perineal pain: Secondary | ICD-10-CM

## 2021-04-05 DIAGNOSIS — O039 Complete or unspecified spontaneous abortion without complication: Secondary | ICD-10-CM

## 2021-04-05 DIAGNOSIS — Z3A18 18 weeks gestation of pregnancy: Secondary | ICD-10-CM | POA: Diagnosis not present

## 2021-04-05 DIAGNOSIS — Z6790 Unspecified blood type, Rh positive: Secondary | ICD-10-CM

## 2021-04-05 DIAGNOSIS — Z0389 Encounter for observation for other suspected diseases and conditions ruled out: Secondary | ICD-10-CM | POA: Diagnosis not present

## 2021-04-05 LAB — CBC
HCT: 35.2 % — ABNORMAL LOW (ref 36.0–46.0)
Hemoglobin: 11.4 g/dL — ABNORMAL LOW (ref 12.0–15.0)
MCH: 27.3 pg (ref 26.0–34.0)
MCHC: 32.4 g/dL (ref 30.0–36.0)
MCV: 84.4 fL (ref 80.0–100.0)
Platelets: 252 10*3/uL (ref 150–400)
RBC: 4.17 MIL/uL (ref 3.87–5.11)
RDW: 14.2 % (ref 11.5–15.5)
WBC: 14.5 10*3/uL — ABNORMAL HIGH (ref 4.0–10.5)
nRBC: 0 % (ref 0.0–0.2)

## 2021-04-05 LAB — TYPE AND SCREEN
ABO/RH(D): B POS
ABO/RH(D): B POS
Antibody Screen: NEGATIVE
Antibody Screen: NEGATIVE

## 2021-04-05 LAB — RESP PANEL BY RT-PCR (FLU A&B, COVID) ARPGX2
Influenza A by PCR: NEGATIVE
Influenza B by PCR: NEGATIVE
SARS Coronavirus 2 by RT PCR: NEGATIVE

## 2021-04-05 MED ORDER — ZOLPIDEM TARTRATE 5 MG PO TABS: 5.0000 mg | ORAL_TABLET | Freq: Every evening | ORAL | Status: DC | PRN

## 2021-04-05 MED ORDER — FENTANYL CITRATE (PF) 100 MCG/2ML IJ SOLN: 50.0000 ug | INTRAMUSCULAR | Status: DC | PRN

## 2021-04-05 MED ORDER — FENTANYL CITRATE (PF) 100 MCG/2ML IJ SOLN
50.0000 ug | INTRAMUSCULAR | Status: DC | PRN
  Administered 2021-04-05: 50 ug via INTRAVENOUS
  Filled 2021-04-05: qty 2

## 2021-04-05 MED ORDER — DOCUSATE SODIUM 100 MG PO CAPS: 100.0000 mg | ORAL_CAPSULE | Freq: Every day | ORAL | Status: DC

## 2021-04-05 MED ORDER — SODIUM CHLORIDE 0.9 % IV SOLN
3.0000 g | Freq: Four times a day (QID) | INTRAVENOUS | Status: DC
  Administered 2021-04-05 – 2021-04-06 (×4): 3 g via INTRAVENOUS
  Filled 2021-04-05 (×5): qty 8

## 2021-04-05 MED ORDER — OXYTOCIN-SODIUM CHLORIDE 30-0.9 UT/500ML-% IV SOLN
40.0000 m[IU]/min | INTRAVENOUS | Status: DC
  Administered 2021-04-05: 40 m[IU]/min via INTRAVENOUS
  Filled 2021-04-05: qty 500

## 2021-04-05 MED ORDER — LABETALOL HCL 5 MG/ML IV SOLN
INTRAVENOUS | Status: AC
  Administered 2021-04-05: 20 mg via INTRAVENOUS
  Filled 2021-04-05: qty 4

## 2021-04-05 MED ORDER — LACTATED RINGERS IV SOLN: INTRAVENOUS | Status: DC

## 2021-04-05 MED ORDER — LABETALOL HCL 5 MG/ML IV SOLN: 20.0000 mg | Freq: Once | INTRAVENOUS | Status: AC

## 2021-04-05 MED ORDER — ACETAMINOPHEN 325 MG PO TABS
650.0000 mg | ORAL_TABLET | ORAL | Status: DC | PRN
  Administered 2021-04-05 – 2021-04-06 (×2): 650 mg via ORAL
  Filled 2021-04-05 (×2): qty 2

## 2021-04-05 MED ORDER — CALCIUM CARBONATE ANTACID 500 MG PO CHEW: 2.0000 | CHEWABLE_TABLET | ORAL | Status: DC | PRN

## 2021-04-05 NOTE — H&P (Signed)
MD H&P - Re-admission - late entry History     CSN: [id number]  Arrival date and time: 04/05/21 0849   Event Date/Time   First Provider Initiated Contact with Patient 04/05/21 1113      Chief Complaint  Patient presents with   Miscarriage   HPI Ms. Susan Sellers is a 30 y.o. G3P0020 at [redacted]w[redacted]d who presents to MAU for home delivery and retained placenta. Patient was discharged yesterday from [facility] after being admitted for PPROM and anhydramnios. Patient states she delivered at 620AM this morning and was trying to tug on the cord to remove the placenta, but the cord "snapped" and the placenta has not yet delivered. Patient reports she could see the umbilical cord hanging from the vagina at home after it tore. Patient denies vaginal bleeding and reports very minimal cramping. Patient declines speaking with a chaplain or other support at this time. Fiance present for entire visit. Patient reports last having a milkshake at 10PM last night and then a few sips of water around 9AM this morning, but denies any other PO intake. Patient aware to be NPO while in MAU until determining a plan of care.   Past Medical History:  Diagnosis Date   Medical history non-contributory     Past Surgical History:  Procedure Laterality Date   WISDOM TOOTH EXTRACTION      Family History  Problem Relation Age of Onset   Heart disease Mother    Hypertension Mother    Hypertension Maternal Grandmother    Hyperlipidemia Paternal Grandmother     Social History   Tobacco Use   Smoking status: Never   Smokeless tobacco: Never  Vaping Use   Vaping Use: Never used  Substance Use Topics   Alcohol use: Not Currently    Alcohol/week: 0.0 standard drinks   Drug use: Not Currently    Types: Marijuana    Comment: last used june 2022    Allergies: No Known Allergies  Medications Prior to Admission  Medication Sig Dispense Refill Last Dose   Prenatal Vit-Fe Fumarate-FA (MULTIVITAMIN-PRENATAL) 27-0.8 MG  TABS tablet Take 1 tablet by mouth daily at 12 noon.   04/04/2021    Review of Systems Physical Exam   Blood pressure (!) 142/73, pulse 91, temperature 99.1 F (37.3 C), temperature source Oral, resp. rate 18, height 5\' 8"  (1.727 m), weight 124.4 kg, last menstrual period 09/18/2020, SpO2 100 %, unknown if currently breastfeeding.  Physical Exam General: AAOx3, tearful  Sterile speculum examination: Purulent discharge, membranes at the external os Membranes grasped with ring forceps and placental tissue removed. Minimal bleeding.   MAU Course  Procedures Manual removal of placenta tissue with ring forceps   Assessment and Plan  30 yo s/p IUFD and SVD at home with retained placenta.  Ultrasound ordered and confirmed still placental tissue present in uterus despite some removal in MAU.  Admit for observation to ob specialty care IV Unasyn for endometritis prophylaxis Discussed with patient option for D&E versus medical management, she prefers medical management.  Will given IV pitocin to help expel remainder of tissue NPO IV fluids Pain management.   Demarious Kapur 04/05/2021, 7:15 PM

## 2021-04-05 NOTE — MAU Provider Note (Signed)
History     CSN: [id number]  Arrival date and time: 04/05/21 0849   Event Date/Time   First Provider Initiated Contact with Patient 04/05/21 [phone]      Chief Complaint  Patient presents with   Miscarriage   Susan Sellers is a 30 y.o. G3P0020 at [redacted]w[redacted]d who presents to MAU for home delivery and retained placenta. Patient was discharged yesterday from [facility] after being admitted for PPROM and anhydramnios. Patient states she delivered at 620AM this morning and was trying to tug on the cord to remove the placenta, but the cord "snapped" and the placenta has not yet delivered. Patient reports she could see the umbilical cord hanging from the vagina at home after it tore. Patient denies vaginal bleeding and reports very minimal cramping. Patient declines speaking with a chaplain or other support at this time. Fiance present for entire visit. Patient reports last having a milkshake at 10PM last night and then a few sips of water around 9AM this morning, but denies any other PO intake. Patient aware to be NPO while in MAU until determining a plan of care.   OB History     Gravida  3   Para  0   Term  0   Preterm  0   AB  2   Living  0      SAB  2   IAB  0   Ectopic  0   Multiple  0   Live Births  0           Past Medical History:  Diagnosis Date   Medical history non-contributory     Past Surgical History:  Procedure Laterality Date   WISDOM TOOTH EXTRACTION      Family History  Problem Relation Age of Onset   Heart disease Mother    Hypertension Mother    Hypertension Maternal Grandmother    Hyperlipidemia Paternal Grandmother     Social History   Tobacco Use   Smoking status: Never   Smokeless tobacco: Never  Vaping Use   Vaping Use: Never used  Substance Use Topics   Alcohol use: Not Currently    Alcohol/week: 0.0 standard drinks   Drug use: Not Currently    Types: Marijuana    Comment: last used june 2022    Allergies: No Known  Allergies  Medications Prior to Admission  Medication Sig Dispense Refill Last Dose   Prenatal Vit-Fe Fumarate-FA (MULTIVITAMIN-PRENATAL) 27-0.8 MG TABS tablet Take 1 tablet by mouth daily at 12 noon.   04/04/2021    Review of Systems  Constitutional:  Negative for chills, diaphoresis, fatigue and fever.  Eyes:  Negative for visual disturbance.  Respiratory:  Negative for shortness of breath.   Cardiovascular:  Negative for chest pain.  Gastrointestinal:  Negative for abdominal pain, constipation, diarrhea, nausea and vomiting.  Genitourinary:  Negative for dysuria, flank pain, frequency, pelvic pain, urgency, vaginal bleeding and vaginal discharge.       Mild cramping.  Neurological:  Negative for dizziness, weakness, light-headedness and headaches.   Physical Exam   Blood pressure 124/75, pulse 90, temperature 98.7 F (37.1 C), temperature source Oral, resp. rate 18, last menstrual period 09/18/2020, SpO2 99 %, unknown if currently breastfeeding.  Patient Vitals for the past 24 hrs:  BP Temp Temp src Pulse Resp SpO2  04/05/21 0925 124/75 98.7 F (37.1 C) Oral 90 18 99 %  04/05/21 0903 (!) 138/92 98.1 F (36.7 C) Oral 100 20 98 %  Physical Exam Vitals and nursing note reviewed.  Constitutional:      General: She is not in acute distress.    Appearance: Normal appearance. She is not ill-appearing, toxic-appearing or diaphoretic.  HENT:     Head: Normocephalic and atraumatic.  Pulmonary:     Effort: Pulmonary effort is normal.  Neurological:     Mental Status: She is alert and oriented to person, place, and time.  Psychiatric:        Mood and Affect: Mood normal.        Behavior: Behavior normal.        Thought Content: Thought content normal.        Judgment: Judgment normal.   MAU Course  Procedures  MDM -pt with 18w delivery at home with retained placenta -consulted with Dr. Charlotta Newton who recommends either admission to [facility] for management or D&E -Dr. Mora Appl called and  notified of recommendations and advised that patient would like to discuss options - Dr. Mora Appl to bedside to discuss with patient -care transferred to Dr. Mora Appl  Assessment and Plan   1. Miscarriage   2. Blood type, Rh positive   3. Pelvic cramping   4. Retained complete placenta    -care transferred to Dr. Levy Sjogren E Kelen Laura 04/05/2021, 10:54 AM

## 2021-04-05 NOTE — MAU Note (Signed)
Had been on [facility] with PPROM.  Was dc'd yesterday with expected management. Delivered the baby at 0620 this morning.  Has not delivered the placenta, "the cord snapped'.  Asking about care of the baby, ? Cremation, box to take the baby.  Denies any cramping, no bleeding yet.

## 2021-04-05 NOTE — Progress Notes (Signed)
Dr. Mora Appl performed pelvic exam.  Placenta removed, looks intact.  VE done by MD.  VB minimal.  Pt tolerated procedure well.

## 2021-04-05 NOTE — Progress Notes (Signed)
Patient delivered retained placental segment sized as measured on ultrasound. Dr. Mora Appl aware and in room. New orders received. Scant bleeding noted on pad. Patient reports no concerns.

## 2021-04-05 NOTE — Progress Notes (Signed)
MD Progress Note   Subjective: Patient reports passing placenta tissue when going to bathroom.    Objective: Observed tissue in specimen container  General: alert, cooperative, and no distress Vaginal Bleeding: minimal   Assessment/Plan:  30 year old with 18 week IUFD and delivery at home  With now completed ab  Blood pressures mild range Discussed with patient possible essential HTN diagnosis.  One dose of IV labetalol given Will f/u in office in one week for BP check after discharge Will continue IV pitocin tonight Will continue Unasyn - one more dose   LOS: 0 days    [facility] 04/05/2021, 7:19 PM

## 2021-04-06 MED ORDER — ACETAMINOPHEN 325 MG PO TABS: 650.0000 mg | ORAL_TABLET | ORAL | Status: DC | PRN

## 2021-04-06 MED ORDER — IBUPROFEN 600 MG PO TABS: 600.0000 mg | ORAL_TABLET | Freq: Four times a day (QID) | ORAL | 0 refills | Status: DC | PRN

## 2021-04-06 NOTE — Discharge Summary (Signed)
Postpartum Discharge Summary  Date of Service updated 04/06/2021     Patient Name: Susan Sellers DOB: [birth year] MRN: [id number]  Date of admission: 04/05/2021 Delivery date:This patient has no babies on file. Delivering provider: This patient has no babies on file. Date of discharge: 04/06/2021  Admitting diagnosis: Retained placenta after delivery without hemorrhage but with other complication [P53.6] Preterm premature delivery of nonviable fetus.  Intrauterine pregnancy: [redacted]w[redacted]d    Secondary diagnosis:  Principal Problem:   Retained placenta after delivery without hemorrhage but with other complication  Additional problems: None    Discharge diagnosis: Preterm Pregnancy Delivered                                              Post partum procedures: IV pitocin for retained products.   Augmentation:  NA Complications: Stillborn at home   Hospital course: Onset of Labor With Vaginal Delivery      30y.o. yo G3P0020 at 184w4das admitted after delivery of previable fetus at home.  on 04/05/2021. Patient patient presented to MAU with retained products, minimal bleeding,  and received IV pitocin with passage of products.   Membrane Rupture Time/Date: This patient has no babies on file.,This patient has no babies on file.  Delivery Method:This patient has no babies on file. Episiotomy: This patient has no babies on file. Lacerations:  This patient has no babies on file. Patient had an uncomplicated postpartum course.  She is ambulating, tolerating a regular diet, passing flatus, and urinating well. Patient is discharged home in stable condition on 04/06/21.  Newborn Data: Birth date:This patient has no babies on file. Birth time:This patient has no babies on file. Gender:This patient has no babies on file. Living status:This patient has no babies on file. Apgars:This patient has no babies on file.,This patient has no babies on file. Weight:This patient has no babies on  file.  Magnesium Sulfate received: No BMZ received: No Rhophylac:N/A MMR:N/A T-DaP: NA Flu: N/A Transfusion:No  Physical exam  Vitals:   04/05/21 1811 04/05/21 1935 04/05/21 2324 04/06/21 0407  BP:  136/64 (!) 112/57 (!) 99/52  Pulse:  85 74 68  Resp:  16 16 16   Temp: 99.1 F (37.3 C) 98.4 F (36.9 C) 98.4 F (36.9 C) 98 F (36.7 C)  TempSrc: Oral Oral Oral Oral  SpO2:  100% 98% 100%  Weight:      Height:       General: alert, cooperative, and no distress Lochia: appropriate Uterine Fundus: firm Incision: N/A DVT Evaluation: No evidence of DVT seen on physical exam. Labs: Lab Results  Component Value Date   WBC 14.5 (H) 04/05/2021   HGB 11.4 (L) 04/05/2021   HCT 35.2 (L) 04/05/2021   MCV 84.4 04/05/2021   PLT 252 04/05/2021   No flowsheet data found. Edinburgh Score: Edinburgh Postnatal Depression Scale Screening Tool 04/06/2021  I have been able to laugh and see the funny side of things. (No Data)      After visit meds:  Allergies as of 04/06/2021   No Known Allergies      Medication List     STOP taking these medications    multivitamin-prenatal 27-0.8 MG Tabs tablet       TAKE these medications    acetaminophen 325 MG tablet Commonly known as: TYLENOL Take 2 tablets (650 mg total) by mouth  every 4 (four) hours as needed (for pain scale < 4  OR  temperature  >/=  100.5 F).   ibuprofen 600 MG tablet Commonly known as: ADVIL Take 1 tablet (600 mg total) by mouth every 6 (six) hours as needed.        Future Appointments:No future appointments. Follow up Visit:  Follow-up Information     Christophe Louis, MD Follow up in 2 week(s).   Specialty: Obstetrics and Gynecology Why: please schedule an appointment for postpartum visit with Dr. Landry Mellow in 2 weeks if you do not already have an appointment scheduled Contact information: [address] [place] [address] [phone]                     04/06/2021 Christophe Louis, MD

## 2021-04-07 LAB — SURGICAL PATHOLOGY

## 2021-04-10 ENCOUNTER — Encounter (HOSPITAL_COMMUNITY): Payer: Self-pay | Admitting: Obstetrics and Gynecology

## 2021-04-10 ENCOUNTER — Inpatient Hospital Stay (HOSPITAL_COMMUNITY): Payer: BC Managed Care – PPO

## 2021-04-10 ENCOUNTER — Observation Stay (HOSPITAL_COMMUNITY)
Admission: AD | Admit: 2021-04-10 | Discharge: 2021-04-11 | Disposition: A | Discharge status: Home / Self Care | Payer: BC Managed Care – PPO | Attending: Obstetrics and Gynecology | Admitting: Obstetrics and Gynecology

## 2021-04-10 ENCOUNTER — Other Ambulatory Visit: Payer: Self-pay

## 2021-04-10 DIAGNOSIS — O165 Unspecified maternal hypertension, complicating the puerperium: Secondary | ICD-10-CM | POA: Diagnosis not present

## 2021-04-10 DIAGNOSIS — Z20822 Contact with and (suspected) exposure to covid-19: Secondary | ICD-10-CM | POA: Diagnosis not present

## 2021-04-10 DIAGNOSIS — O8612 Endometritis following delivery: Secondary | ICD-10-CM | POA: Diagnosis not present

## 2021-04-10 DIAGNOSIS — O9081 Anemia of the puerperium: Secondary | ICD-10-CM | POA: Diagnosis present

## 2021-04-10 DIAGNOSIS — N939 Abnormal uterine and vaginal bleeding, unspecified: Secondary | ICD-10-CM | POA: Diagnosis not present

## 2021-04-10 DIAGNOSIS — D62 Acute posthemorrhagic anemia: Secondary | ICD-10-CM | POA: Diagnosis not present

## 2021-04-10 DIAGNOSIS — I10 Essential (primary) hypertension: Secondary | ICD-10-CM | POA: Diagnosis not present

## 2021-04-10 LAB — HCG, QUANTITATIVE, PREGNANCY: hCG, Beta Chain, Quant, S: 3330 m[IU]/mL — ABNORMAL HIGH (ref <5)

## 2021-04-10 LAB — CBC WITH DIFFERENTIAL/PLATELET
Abs Immature Granulocytes: 0.11 10*3/uL — ABNORMAL HIGH (ref 0.00–0.07)
Basophils Absolute: 0.1 10*3/uL (ref 0.0–0.1)
Basophils Relative: 0 %
Eosinophils Absolute: 0.1 10*3/uL (ref 0.0–0.5)
Eosinophils Relative: 1 %
HCT: 32.3 % — ABNORMAL LOW (ref 36.0–46.0)
Hemoglobin: 10.7 g/dL — ABNORMAL LOW (ref 12.0–15.0)
Immature Granulocytes: 1 %
Lymphocytes Relative: 11 %
Lymphs Abs: 2 10*3/uL (ref 0.7–4.0)
MCH: 27.7 pg (ref 26.0–34.0)
MCHC: 33.1 g/dL (ref 30.0–36.0)
MCV: 83.7 fL (ref 80.0–100.0)
Monocytes Absolute: 1.8 10*3/uL — ABNORMAL HIGH (ref 0.1–1.0)
Monocytes Relative: 9 %
Neutro Abs: 14.9 10*3/uL — ABNORMAL HIGH (ref 1.7–7.7)
Neutrophils Relative %: 78 %
Platelets: 254 10*3/uL (ref 150–400)
RBC: 3.86 MIL/uL — ABNORMAL LOW (ref 3.87–5.11)
RDW: 14 % (ref 11.5–15.5)
WBC: 19 10*3/uL — ABNORMAL HIGH (ref 4.0–10.5)
nRBC: 0 % (ref 0.0–0.2)

## 2021-04-10 LAB — RESP PANEL BY RT-PCR (FLU A&B, COVID) ARPGX2
Influenza A by PCR: NEGATIVE
Influenza B by PCR: NEGATIVE
SARS Coronavirus 2 by RT PCR: NEGATIVE

## 2021-04-10 MED ORDER — PRENATAL MULTIVITAMIN CH
1.0000 | ORAL_TABLET | Freq: Every day | ORAL | Status: DC
  Administered 2021-04-10: 1 via ORAL
  Filled 2021-04-10 (×2): qty 1

## 2021-04-10 MED ORDER — SIMETHICONE 80 MG PO CHEW: 80.0000 mg | CHEWABLE_TABLET | Freq: Four times a day (QID) | ORAL | Status: DC | PRN

## 2021-04-10 MED ORDER — ONDANSETRON HCL 4 MG PO TABS: 4.0000 mg | ORAL_TABLET | Freq: Four times a day (QID) | ORAL | Status: DC | PRN

## 2021-04-10 MED ORDER — ONDANSETRON HCL 4 MG/2ML IJ SOLN: 4.0000 mg | Freq: Four times a day (QID) | INTRAMUSCULAR | Status: DC | PRN

## 2021-04-10 MED ORDER — MISOPROSTOL 200 MCG PO TABS
800.0000 ug | ORAL_TABLET | Freq: Once | ORAL | Status: AC
  Administered 2021-04-10: 800 ug via RECTAL
  Filled 2021-04-10: qty 4

## 2021-04-10 MED ORDER — LACTATED RINGERS IV SOLN: INTRAVENOUS | Status: DC

## 2021-04-10 MED ORDER — METRONIDAZOLE 500 MG PO TABS
500.0000 mg | ORAL_TABLET | Freq: Four times a day (QID) | ORAL | Status: DC
  Administered 2021-04-10 – 2021-04-11 (×5): 500 mg via ORAL
  Filled 2021-04-10 (×5): qty 1

## 2021-04-10 MED ORDER — DOXYCYCLINE HYCLATE 100 MG PO TABS
100.0000 mg | ORAL_TABLET | Freq: Two times a day (BID) | ORAL | Status: DC
  Administered 2021-04-10 – 2021-04-11 (×3): 100 mg via ORAL
  Filled 2021-04-10 (×3): qty 1

## 2021-04-10 MED ORDER — MISOPROSTOL 50MCG HALF TABLET
50.0000 ug | ORAL_TABLET | Freq: Four times a day (QID) | ORAL | Status: DC
  Administered 2021-04-10: 50 ug via ORAL
  Filled 2021-04-10: qty 1

## 2021-04-10 MED ORDER — OXYCODONE-ACETAMINOPHEN 5-325 MG PO TABS: 1.0000 | ORAL_TABLET | ORAL | Status: DC | PRN

## 2021-04-10 MED ORDER — IBUPROFEN 600 MG PO TABS: 600.0000 mg | ORAL_TABLET | Freq: Four times a day (QID) | ORAL | Status: DC | PRN

## 2021-04-10 NOTE — MAU Note (Addendum)
...  D'Endrae Welford is a 30 y.o. here in MAU reporting: heavy vaginal bleeding that began this morning around 0730 after having a bowel movement. She states she strained while using the restroom and she thought she was also urinating at the time but looked into the toilet and noticed it was blood. She states she began having lower abdominal cramps that she rates 8/10. She states she lost two blood clots that were a little smaller than a golf ball. She placed a maxi pad on and came here to MAU. Once arriving to MAU she had soaked through the pad and into her pants. Patient passed blood clots into toilet that are undeterminable in size due to amount of blood in toilet.   She states she had a fever last night of 101.4 and took Tylenol. She states she took 500 mg Tylenol and 600 mg of Ibuprofen at 0745.  Patient PPROM'd on 04/04/2019 and was discharged home. Patient returned to MAU on 04/05/2021 after passing the fetus. Patient was [redacted]w[redacted]d. Patient states she was discharged from [facility] this past Monday and states she has only been experiencing vaginal spotting prior to this episode.  Pain score: 8/10 lower abdominal cramping  Vitals:   04/10/21 0848  Temp: 97.8 F (36.6 C)

## 2021-04-10 NOTE — H&P (Signed)
Susan Sellers is an 30 y.o. female. With heavy bleeding and fever.  A clot was removed from the cervix and the bleeding slowed down.  [place] still shows retained POC.  She denies dizziness and SOB.  She just delived a previable fetus on 11/20  Pertinent Gynecological History: Menses: flow is moderate Bleeding: seea above Contraception: none DES exposure: denies Blood transfusions: none Sexually transmitted diseases:  Previous GYN Procedures:  na   Last mammogram:  na  Date:  Last pap: normal Date:  OB History: G3, P0   Menstrual History: Menarche age: 55 No LMP recorded.    Past Medical History:  Diagnosis Date   Medical history non-contributory     Past Surgical History:  Procedure Laterality Date   WISDOM TOOTH EXTRACTION      Family History  Problem Relation Age of Onset   Heart disease Mother    Hypertension Mother    Hypertension Maternal Grandmother    Hyperlipidemia Paternal Grandmother     Social History:  reports that she has never smoked. She has never used smokeless tobacco. She reports that she does not currently use alcohol. She reports that she does not currently use drugs after having used the following drugs: Marijuana.  Allergies: No Known Allergies  Medications Prior to Admission  Medication Sig Dispense Refill Last Dose   acetaminophen (TYLENOL) 325 MG tablet Take 2 tablets (650 mg total) by mouth every 4 (four) hours as needed (for pain scale < 4  OR  temperature  >/=  100.5 F).   04/10/2021 at 0745   ibuprofen (ADVIL) 600 MG tablet Take 1 tablet (600 mg total) by mouth every 6 (six) hours as needed. 30 tablet 0 04/10/2021 at 0745    Review of Systems  Blood pressure (!) 146/88, pulse 88, temperature 97.8 F (36.6 C), temperature source Oral, SpO2 98 %, unknown if currently breastfeeding.  Physical Examination: General appearance - alert, well appearing, and in no distress Abdomen - soft, nontender, nondistended, no masses or organomegaly Pelvic  - normal external genitalia, vulva, vagina, cervix, uterus and adnexa, VULVA: normal appearing vulva with no masses, tenderness or lesions, moderae VB, VAGINA: normal appearing vagina with normal color and discharge, no lesions,  Extremities - peripheral pulses normal, no pedal edema, no clubbing or cyanosis   Results for orders placed or performed during the hospital encounter of 04/10/21 (from the past 24 hour(s))  CBC with Differential/Platelet     Status: Abnormal   Collection Time: 04/10/21  8:54 AM  Result Value Ref Range   WBC 19.0 (H) 4.0 - 10.5 K/uL   RBC 3.86 (L) 3.87 - 5.11 MIL/uL   Hemoglobin 10.7 (L) 12.0 - 15.0 g/dL   HCT 70.2 (L) 63.7 - 85.8 %   MCV 83.7 80.0 - 100.0 fL   MCH 27.7 26.0 - 34.0 pg   MCHC 33.1 30.0 - 36.0 g/dL   RDW 85.0 27.7 - 41.2 %   Platelets 254 150 - 400 K/uL   nRBC 0.0 0.0 - 0.2 %   Neutrophils Relative % 78 %   Neutro Abs 14.9 (H) 1.7 - 7.7 K/uL   Lymphocytes Relative 11 %   Lymphs Abs 2.0 0.7 - 4.0 K/uL   Monocytes Relative 9 %   Monocytes Absolute 1.8 (H) 0.1 - 1.0 K/uL   Eosinophils Relative 1 %   Eosinophils Absolute 0.1 0.0 - 0.5 K/uL   Basophils Relative 0 %   Basophils Absolute 0.1 0.0 - 0.1 K/uL   Immature  Granulocytes 1 %   Abs Immature Granulocytes 0.11 (H) 0.00 - 0.07 K/uL  hCG, quantitative, pregnancy     Status: Abnormal   Collection Time: 04/10/21  8:54 AM  Result Value Ref Range   hCG, Beta Chain, Quant, S 3,330 (H) <5 mIU/mL    US PELVIS LIMITED (TRANSABDOMINAL ONLY)  Result Date: 04/10/2021 CLINICAL DATA:  Bleeding.  Recent vaginal delivery EXAM: TRANSABDOMINAL ULTRASOUND OF PELVIS TECHNIQUE: Transabdominal ultrasound examination of the pelvis was performed including evaluation of the uterus, ovaries, adnexal regions, and pelvic cul-de-sac. COMPARISON:  04/05/2021 FINDINGS: Uterus Measurements: 10.2 x 6.7 x 8.4 cm = volume: 301 mL. No focal abnormality Endometrium Thickness: Thickened endometrium measuring 23 mm.  Heterogeneous with increased vascularity. Findings concerning for retained products of conception. Right ovary Measurements: 2.5 x 2.2 x 2.5 cm = volume: 7 mL. Normal appearance/no adnexal mass. Left ovary Measurements: 2.4 x 1.7 x 2.1 cm = volume: 5 mL. Normal appearance/no adnexal mass. Other findings:  No abnormal free fluid. IMPRESSION: Thickened, heterogeneous, endometrium with vascularity. Findings concerning for retained products of conception. Electronically Signed   By: Charlett Nose M.D.   On: 04/10/2021 09:55    Assessment/Plan: Retained POC after recent delivery of 20 week fetus Pt reports a fever last night.  Will give doxycycline and flagyl She declined D&C will give cytotec to try to remove the POC. Her BP is elevated and would avoid methergine Check CBC in the morning  Ayshia Gramlich A Zafira Munos 04/10/2021, 11:47 AM

## 2021-04-10 NOTE — MAU Provider Note (Signed)
History     CSN: [id number]  Arrival date and time: 04/10/21 8466   Event Date/Time   First Provider Initiated Contact with Patient 04/10/21 [phone]      Chief Complaint  Patient presents with   Abdominal Pain   Vaginal Bleeding   HPI [name] is a 30 y.o. [id number] who presents with vaginal bleeding. She reports after a bowel movement this am, she started having heavy bright red bleeding. She states she filled the toilet bowl, put on a maxi pad and saturated it in less than an hour. She also reports lower abdominal cramping that she rates an 8/10 and tried tylenol and ibuprofen for at 0745 with no relief. She reports she had a fever of 101.4 last night and had chills and felt generally unwell.   She delivered an 18 week fetus on 11/20 and was admitted due to retained placenta at that time. She received IV pitocin and passed what was believed to be the placenta before discharge. She has had no more symptoms until today.   OB History     Gravida  3   Para  1   Term  0   Preterm  1   AB  2   Living  0      SAB  2   IAB  0   Ectopic  0   Multiple  0   Live Births  0           Past Medical History:  Diagnosis Date   Medical history non-contributory     Past Surgical History:  Procedure Laterality Date   WISDOM TOOTH EXTRACTION      Family History  Problem Relation Age of Onset   Heart disease Mother    Hypertension Mother    Hypertension Maternal Grandmother    Hyperlipidemia Paternal Grandmother     Social History   Tobacco Use   Smoking status: Never   Smokeless tobacco: Never  Vaping Use   Vaping Use: Never used  Substance Use Topics   Alcohol use: Not Currently    Alcohol/week: 0.0 standard drinks   Drug use: Not Currently    Types: Marijuana    Comment: last used june 2022    Allergies: No Known Allergies  Medications Prior to Admission  Medication Sig Dispense Refill Last Dose   acetaminophen (TYLENOL) 325 MG tablet Take 2  tablets (650 mg total) by mouth every 4 (four) hours as needed (for pain scale < 4  OR  temperature  >/=  100.5 F).   04/10/2021 at 0745   ibuprofen (ADVIL) 600 MG tablet Take 1 tablet (600 mg total) by mouth every 6 (six) hours as needed. 30 tablet 0 04/10/2021 at 0745    Review of Systems  Constitutional: Negative.  Negative for fatigue and fever.  HENT: Negative.    Respiratory: Negative.  Negative for shortness of breath.   Cardiovascular: Negative.  Negative for chest pain.  Gastrointestinal:  Positive for abdominal pain. Negative for constipation, diarrhea, nausea and vomiting.  Genitourinary:  Positive for vaginal bleeding. Negative for dysuria and vaginal discharge.  Neurological: Negative.  Negative for dizziness and headaches.  Physical Exam   Temperature 97.8 F (36.6 C), temperature source Oral, last menstrual period 09/18/2020, unknown if currently breastfeeding.  Physical Exam Vitals and nursing note reviewed.  Constitutional:      General: She is not in acute distress.    Appearance: She is well-developed.  HENT:     Head:  Normocephalic.  Eyes:     Pupils: Pupils are equal, round, and reactive to light.  Cardiovascular:     Rate and Rhythm: Normal rate and regular rhythm.     Heart sounds: Normal heart sounds.  Pulmonary:     Effort: Pulmonary effort is normal. No respiratory distress.     Breath sounds: Normal breath sounds.  Abdominal:     General: Bowel sounds are normal. There is no distension.     Palpations: Abdomen is soft.     Tenderness: There is no abdominal tenderness.  Genitourinary:    Comments: SSE: large amount of products of conception removed from cervical os, minimal bleeding after removal Skin:    General: Skin is warm and dry.  Neurological:     Mental Status: She is alert and oriented to person, place, and time.  Psychiatric:        Mood and Affect: Mood normal.        Behavior: Behavior normal.        Thought Content: Thought content  normal.        Judgment: Judgment normal.    MAU Course  Procedures Results for orders placed or performed during the hospital encounter of 04/10/21 (from the past 24 hour(s))  CBC with Differential/Platelet     Status: Abnormal   Collection Time: 04/10/21  8:54 AM  Result Value Ref Range   WBC 19.0 (H) 4.0 - 10.5 K/uL   RBC 3.86 (L) 3.87 - 5.11 MIL/uL   Hemoglobin 10.7 (L) 12.0 - 15.0 g/dL   HCT 15.7 (L) 26.2 - 03.5 %   MCV 83.7 80.0 - 100.0 fL   MCH 27.7 26.0 - 34.0 pg   MCHC 33.1 30.0 - 36.0 g/dL   RDW 59.7 41.6 - 38.4 %   Platelets 254 150 - 400 K/uL   nRBC 0.0 0.0 - 0.2 %   Neutrophils Relative % 78 %   Neutro Abs 14.9 (H) 1.7 - 7.7 K/uL   Lymphocytes Relative 11 %   Lymphs Abs 2.0 0.7 - 4.0 K/uL   Monocytes Relative 9 %   Monocytes Absolute 1.8 (H) 0.1 - 1.0 K/uL   Eosinophils Relative 1 %   Eosinophils Absolute 0.1 0.0 - 0.5 K/uL   Basophils Relative 0 %   Basophils Absolute 0.1 0.0 - 0.1 K/uL   Immature Granulocytes 1 %   Abs Immature Granulocytes 0.11 (H) 0.00 - 0.07 K/uL  hCG, quantitative, pregnancy     Status: Abnormal   Collection Time: 04/10/21  8:54 AM  Result Value Ref Range   hCG, Beta Chain, Quant, S 3,330 (H) <5 mIU/mL  Resp Panel by RT-PCR (Flu A&B, Covid) Nasopharyngeal Swab     Status: None   Collection Time: 04/10/21 10:31 AM   Specimen: Nasopharyngeal Swab; Nasopharyngeal(NP) swabs in vial transport medium  Result Value Ref Range   SARS Coronavirus 2 by RT PCR NEGATIVE NEGATIVE   Influenza A by PCR NEGATIVE NEGATIVE   Influenza B by PCR NEGATIVE NEGATIVE    US PELVIS LIMITED (TRANSABDOMINAL ONLY)  Result Date: 04/10/2021 CLINICAL DATA:  Bleeding.  Recent vaginal delivery EXAM: TRANSABDOMINAL ULTRASOUND OF PELVIS TECHNIQUE: Transabdominal ultrasound examination of the pelvis was performed including evaluation of the uterus, ovaries, adnexal regions, and pelvic cul-de-sac. COMPARISON:  04/05/2021 FINDINGS: Uterus Measurements: 10.2 x 6.7 x 8.4 cm =  volume: 301 mL. No focal abnormality Endometrium Thickness: Thickened endometrium measuring 23 mm. Heterogeneous with increased vascularity. Findings concerning for retained products of conception. Right  ovary Measurements: 2.5 x 2.2 x 2.5 cm = volume: 7 mL. Normal appearance/no adnexal mass. Left ovary Measurements: 2.4 x 1.7 x 2.1 cm = volume: 5 mL. Normal appearance/no adnexal mass. Other findings:  No abnormal free fluid. IMPRESSION: Thickened, heterogeneous, endometrium with vascularity. Findings concerning for retained products of conception. Electronically Signed   By: Charlett Nose M.D.   On: 04/10/2021 09:55     MDM CBC, HCG Surgical pathology Large amount of products of conception removed from cervical os. Immediate resolution of pain and bleeding US Pelvis Complete- Products of conception with vascularity still seen  Consulted with Dr. Vergie Living- recommends admission for IV antibioitics and further management of retained products.   Dr. Normand Sloop notified of patient arrival in MAU and results- will come see patient in MAU  Assessment and Plan   1. Postpartum bleeding    -Admit to [facility]Care turned over to MD   Rolm Bookbinder CNM 04/10/2021, 8:59 AM

## 2021-04-11 DIAGNOSIS — I10 Essential (primary) hypertension: Secondary | ICD-10-CM | POA: Diagnosis not present

## 2021-04-11 DIAGNOSIS — D62 Acute posthemorrhagic anemia: Secondary | ICD-10-CM | POA: Diagnosis not present

## 2021-04-11 LAB — CBC
HCT: 26.9 % — ABNORMAL LOW (ref 36.0–46.0)
Hemoglobin: 8.7 g/dL — ABNORMAL LOW (ref 12.0–15.0)
MCH: 27.4 pg (ref 26.0–34.0)
MCHC: 32.3 g/dL (ref 30.0–36.0)
MCV: 84.9 fL (ref 80.0–100.0)
Platelets: 225 10*3/uL (ref 150–400)
RBC: 3.17 MIL/uL — ABNORMAL LOW (ref 3.87–5.11)
RDW: 14 % (ref 11.5–15.5)
WBC: 14.2 10*3/uL — ABNORMAL HIGH (ref 4.0–10.5)
nRBC: 0 % (ref 0.0–0.2)

## 2021-04-11 MED ORDER — POLYSACCHARIDE IRON COMPLEX 150 MG PO CAPS
150.0000 mg | ORAL_CAPSULE | Freq: Every day | ORAL | Status: DC
  Administered 2021-04-11: 150 mg via ORAL
  Filled 2021-04-11: qty 1

## 2021-04-11 MED ORDER — METRONIDAZOLE 500 MG PO TABS: 500.0000 mg | ORAL_TABLET | Freq: Two times a day (BID) | ORAL | 0 refills | Status: AC

## 2021-04-11 MED ORDER — POLYSACCHARIDE IRON COMPLEX 150 MG PO CAPS: 150.0000 mg | ORAL_CAPSULE | Freq: Every day | ORAL | 1 refills | Status: DC

## 2021-04-11 MED ORDER — IBUPROFEN 600 MG PO TABS: 600.0000 mg | ORAL_TABLET | Freq: Four times a day (QID) | ORAL | 0 refills | Status: DC | PRN

## 2021-04-11 MED ORDER — DOXYCYCLINE HYCLATE 100 MG PO TABS
100.0000 mg | ORAL_TABLET | Freq: Two times a day (BID) | ORAL | 0 refills | Status: AC
Start: 2021-04-11 — End: 2021-04-25

## 2021-04-11 NOTE — Plan of Care (Signed)

## 2021-04-11 NOTE — Progress Notes (Signed)
Discharge instructions given to patient. Questions regarding plan of care answered. Patient verbalized understanding and had no further questions. MD aware of increased BP, (see assessment). Plan to follow up care with [facility] physicians ASAP.

## 2021-04-11 NOTE — Discharge Summary (Signed)
[facility] physician DR Richardson Dopp pt OB Discharge Summary     Patient Name: Susan Sellers DOB: 05-May-1991 MRN: [id number]  Date of admission: 04/10/2021 Delivering MD: This patient has no babies on file. Date of Discharge: 04/11/2021  Admitting diagnosis: Retained products of conception after delivery without hemorrhage [O73.1] Intrauterine pregnancy: Unknown     Secondary diagnosis:  Principal Problem:   Retained products of conception after delivery without hemorrhage Active Problems:   Hypertension   Acute blood loss anemia                             History of Present Illness: Susan Sellers is a 30 y.o. female, G3P0120, who presents at Unknown weeks gestation. The patient has been followed at  [facility] with DR Richardson Dopp for her prenatal course.  Patient Active Problem List   Diagnosis Date Noted   Hypertension 04/11/2021   Acute blood loss anemia 04/11/2021   Retained products of conception after delivery without hemorrhage 04/10/2021   Retained placenta after delivery without hemorrhage but with other complication 04/05/2021   Preterm premature rupture of membranes 04/03/2021   BMI 50.0-59.9, adult--morbid obesity 01/05/2015     Active Ambulatory Problems    Diagnosis Date Noted   BMI 50.0-59.9, adult--morbid obesity 01/05/2015   Preterm premature rupture of membranes 04/03/2021   Retained placenta after delivery without hemorrhage but with other complication 04/05/2021   Resolved Ambulatory Problems    Diagnosis Date Noted   No Resolved Ambulatory Problems   Past Medical History:  Diagnosis Date   Medical history non-contributory      Hospital course:   Postpartum Day # 6 :  04/10/2021 Admission HPI: Susan Sellers is an 30 y.o. female. With heavy bleeding and fever per pt not documented fever in pt.  A clot was removed from the cervix and the bleeding slowed down.  [place] still shows retained POC.  She denies dizziness and SOB.  She just delived a previable  fetus on 11/20.  04/05/2021 [place] resulted: IMPRESSION: 1. The findings are consistent with retained products of conception. Pt received manual exploration   04/06/2021: Hospital course: Onset of Labor With Vaginal Delivery      30 y.o. yo G3P0020 at [redacted]w[redacted]d was admitted after delivery of previable fetus at home.  on   04/05/2021 Admission HPI: 30 yo s/p IUFD and SVD at home with retained placenta.  Ultrasound ordered and confirmed still placental tissue present in uterus despite some removal in MAU.  Admit for observation to ob specialty care IV Unasyn for endometritis prophylaxis Discussed with patient option for D&E versus medical management, she prefers medical management.  Will given IV pitocin to help expel remainder of tissue NPO IV fluids Pain management.   04/05/2021. Patient patient presented to MAU with retained products, minimal bleeding,  and received IV pitocin with passage of products.    Membrane Rupture Time/Date: This patient has no babies on file.,This patient has no babies on file.  Delivery Method:This patient has no babies on file. Episiotomy: This patient has no babies on file. Lacerations:  This patient has no babies on file. Patient had an uncomplicated postpartum course.  She is ambulating, tolerating a regular diet, passing flatus, and urinating well. Patient is discharged home in stable condition on 04/06/21.  04/11/2021: Pt stable sitting in bed with husband aat bed side. Reviewed POC. Questions answered. Over last night pt has remained afebrile, WBC trended down  19-14.2, hgb 10.7-8.7, asymptomatic, placed on po Iron and will be discharge to home on iron. Pt is now on day 2 of doxy 100mg  BID PO and will continue to 14 days after discharged, pt also on day 2 of flagyl 500mg  BID PO and will continue for 14 days after discharge. Pt denies abdominal cramping, denies abdominal pain, endorses passing several small clots over last night that was reported to be POC. Pt is  s/p rectal cytotec x1 dose, and PO cytotec x1 dose, pt endorses desires for repeat today, consulted with Dr , recommendation for d/c with flagyl and doxy, and repeat out pt [place] with Dr Normand Sloop this week. Discussed with pt reason for not repeating and [place] 1 day later, pt declined D&E again, pt aslo desired to go home on cytotec, DR Dillard does not recommend this. Pt also had elevated BP 146/74, denies HA, RUQ pain or vision changes, denies being dx with HTN, meets criterai for new dx of CHTN, no medication indicated at this time, BP currently 133/86, will f/u with [facility].    11/25 [place] resulted:   IMPRESSION: Thickened, heterogeneous, endometrium with vascularity. Findings concerning for retained products of conception.   Physical exam  Vitals:   04/10/21 1950 04/10/21 2315 04/11/21 0359 04/11/21 0737  BP:  131/71 (!) 146/74 133/86  Pulse:  78 73 82  Resp:  16 18 17   Temp: 98.3 F (36.8 C) 99.3 F (37.4 C) 98 F (36.7 C)   TempSrc: Oral Oral Oral   SpO2:  100% 99% 99%   General: alert, cooperative, and no distress Lochia: appropriate Uterine Fundus: firm Perineum: Intact DVT Evaluation: No evidence of DVT seen on physical exam. Negative Homan's sign. No cords or calf tenderness. No significant calf/ankle edema.  Labs: Lab Results  Component Value Date   WBC 14.2 (H) 04/11/2021   HGB 8.7 (L) 04/11/2021   HCT 26.9 (L) 04/11/2021   MCV 84.9 04/11/2021   PLT 225 04/11/2021   No flowsheet data found.  Date of discharge: 04/11/2021 Discharge Diagnoses:  Retained Products Discharge instruction: per After Visit Summary and "Baby and Me Booklet".  After visit meds:   Activity:           unrestricted and pelvic rest Advance as tolerated. Pelvic rest for 6 weeks.  Diet:                routine Medications: Ibuprofen, Iron, and doxy, flagyl Postpartum contraception: Not Discussed Condition:  Pt discharge to home in stable condition   Meds: Allergies as of  04/11/2021   No Known Allergies      Medication List     TAKE these medications    acetaminophen 325 MG tablet Commonly known as: TYLENOL Take 2 tablets (650 mg total) by mouth every 4 (four) hours as needed (for pain scale < 4  OR  temperature  >/=  100.5 F).   doxycycline 100 MG tablet Commonly known as: VIBRA-TABS Take 1 tablet (100 mg total) by mouth every 12 (twelve) hours for 14 days.   ibuprofen 600 MG tablet Commonly known as: ADVIL Take 1 tablet (600 mg total) by mouth every 6 (six) hours as needed. What changed: Another medication with the same name was added. Make sure you understand how and when to take each.   ibuprofen 600 MG tablet Commonly known as: ADVIL Take 1 tablet (600 mg total) by mouth every 6 (six) hours as needed (mild pain). What changed: You were already  taking a medication with the same name, and this prescription was added. Make sure you understand how and when to take each.   iron polysaccharides 150 MG capsule Commonly known as: NIFEREX Take 1 capsule (150 mg total) by mouth daily.   metroNIDAZOLE 500 MG tablet Commonly known as: FLAGYL Take 1 tablet (500 mg total) by mouth 2 (two) times daily for 14 days.        Discharge Follow Up:   Follow-up Information     Gynecology, [facility]. Schedule an appointment as soon as possible for a visit.   Specialty: Obstetrics and Gynecology Why: Call [facility] on MOnday for [place], Beta, CBC, and PE. Contact information: [address] [place] [address] [phone]                  [facility], NP-C, CNM 04/11/2021, 12:04 PM  [place], FNP

## 2021-04-13 LAB — SURGICAL PATHOLOGY

## 2021-04-16 DIAGNOSIS — O039 Complete or unspecified spontaneous abortion without complication: Secondary | ICD-10-CM | POA: Diagnosis not present

## 2021-04-20 IMAGING — US US OB < 14 WEEKS - US OB TV
1 series · 15 of 28 positions shown · non-contrast
Comparison: None.

CLINICAL DATA: Positive urine pregnancy test. Vaginal bleeding.
Estimated gestational age by last menstrual period equals 6 weeks 1
day

EXAM:
OBSTETRIC <14 WK US AND TRANSVAGINAL OB US
TECHNIQUE: Both transabdominal and transvaginal ultrasound examinations were
performed for complete evaluation of the gestation as well as the
maternal uterus, adnexal regions, and pelvic cul-de-sac.
Transvaginal technique was performed to assess early pregnancy.

[Series 1: us ob < 14 weeks - us ob tv · 47 acquisitions, 15 frames shown]
[im 1/47]
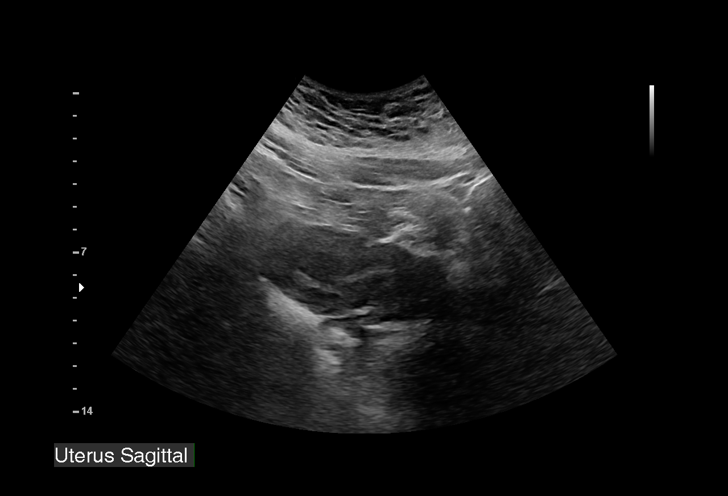
[im 4/47]
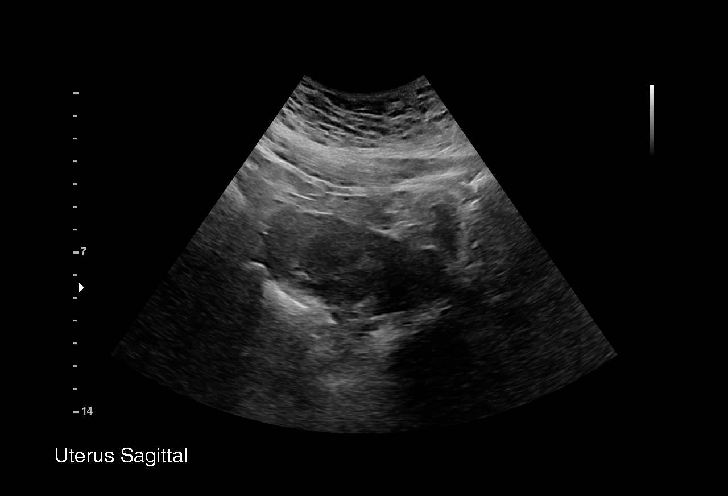
[im 7/47]
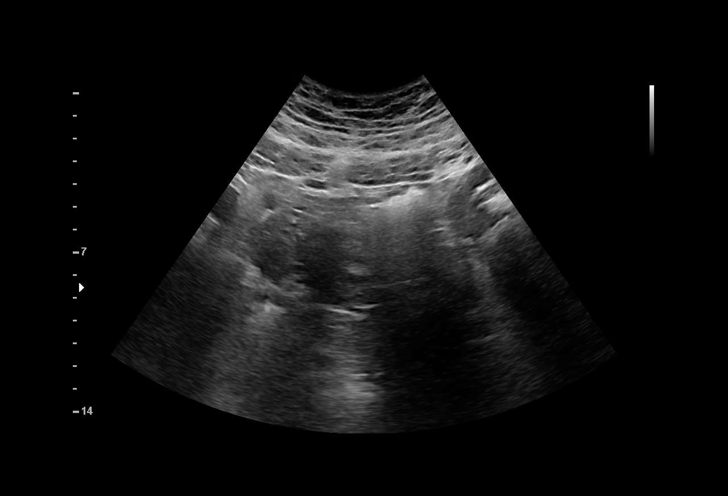
[im 11/47]
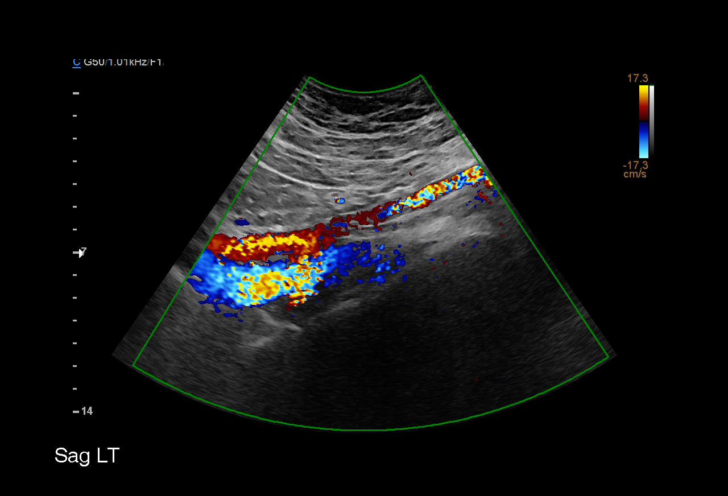
[im 14/47]
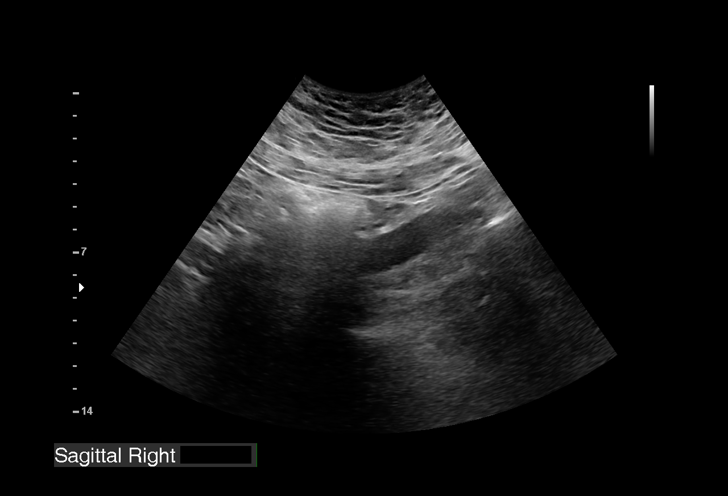
[im 18/47]
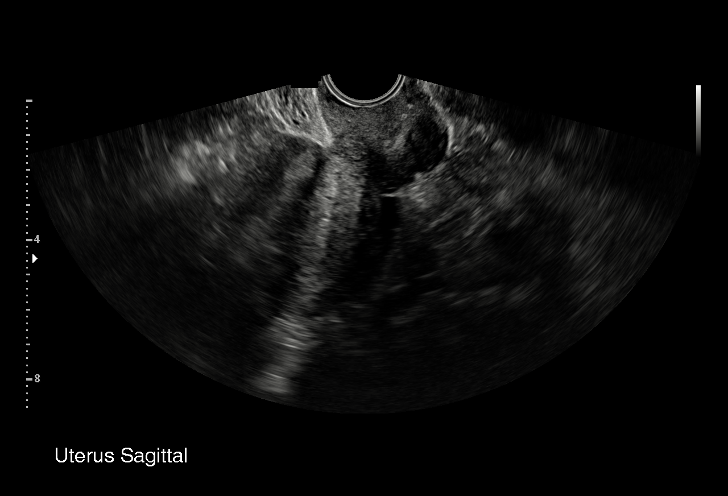
[im 21/47]
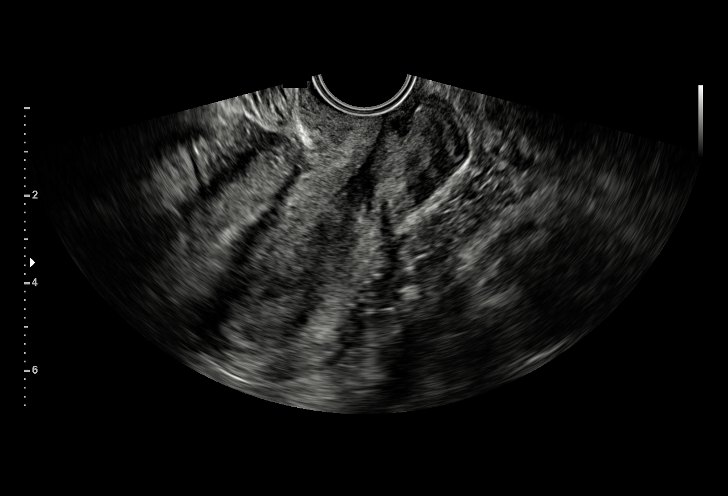
[im 24/47]
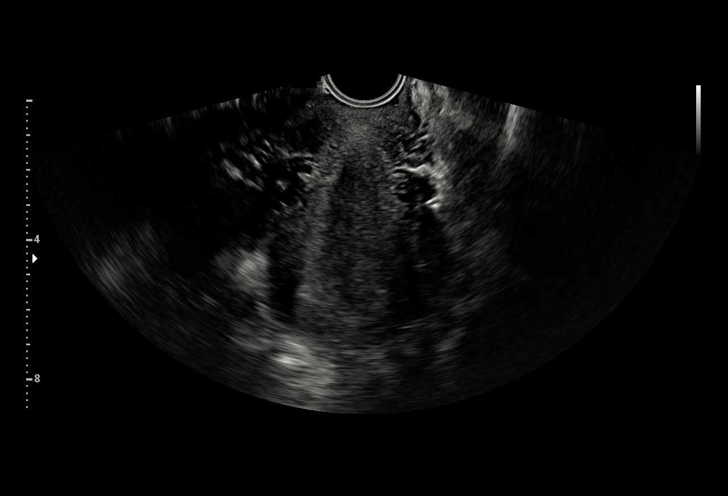
[im 26/47]
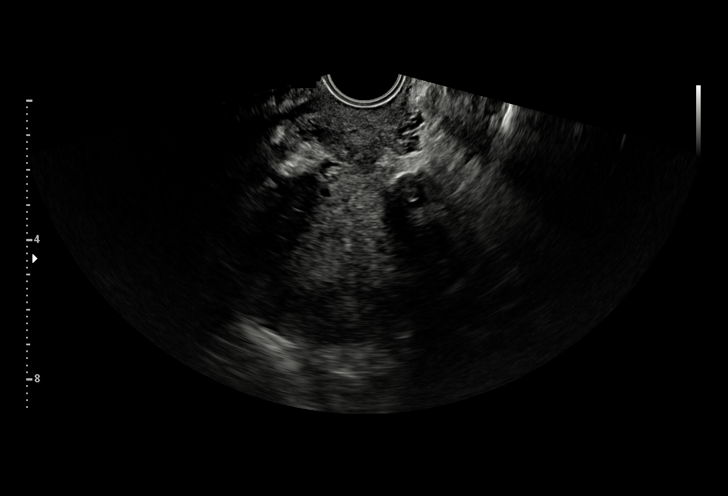
[im 29/47]
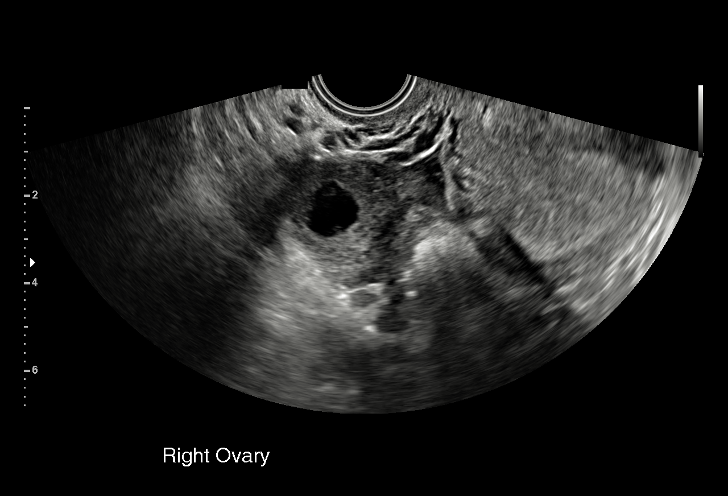
[im 33/47]
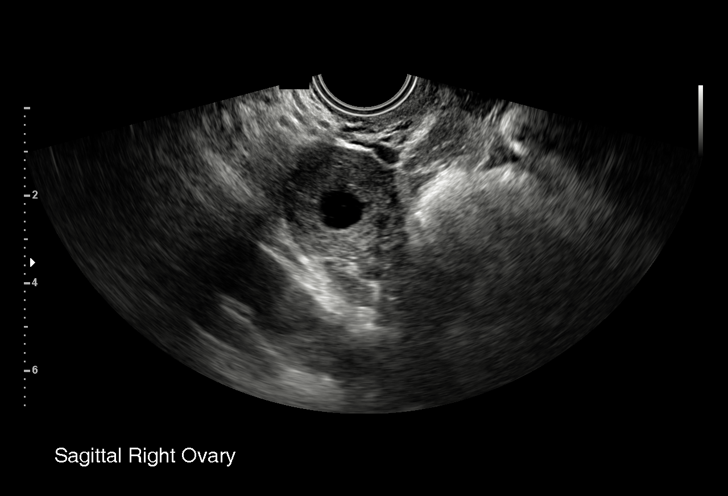
[im 36/47]
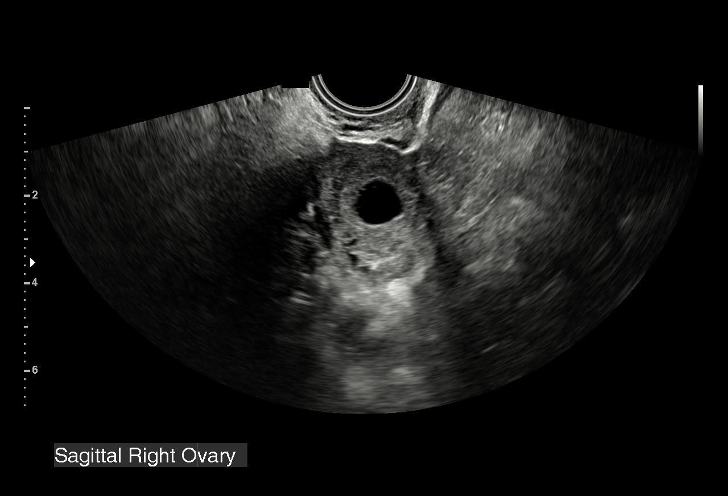
[im 40/47]
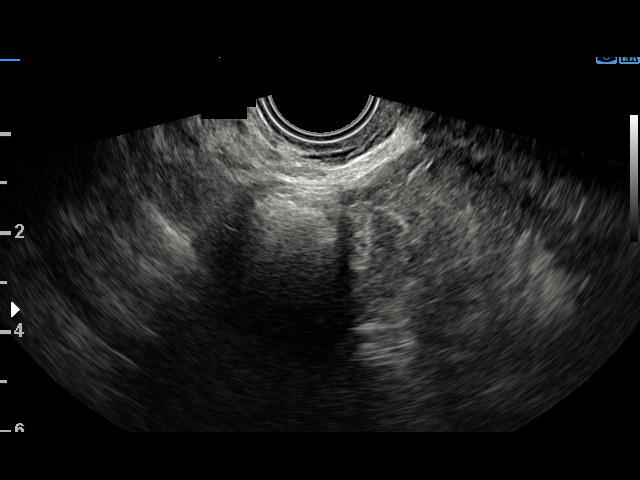
[im 43/47]
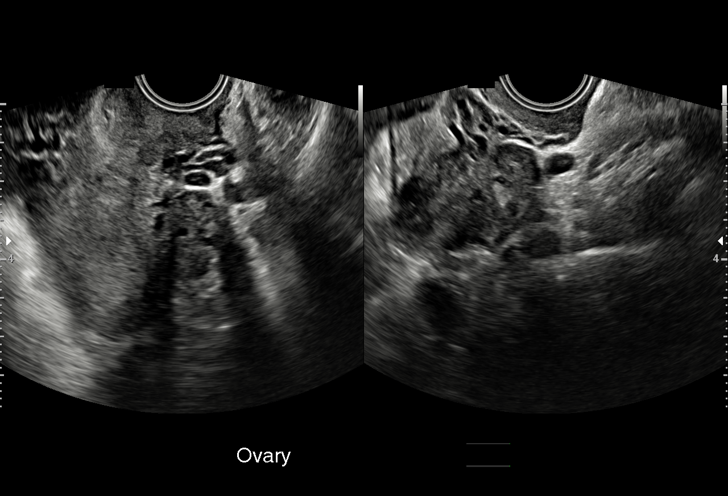
[im 47/47]
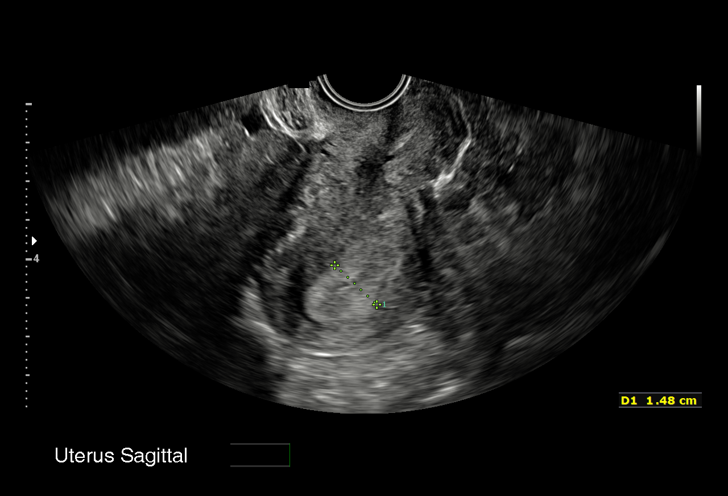

[15 of 28 positions shown; findings below may reference images not displayed]

FINDINGS: Intrauterine gestational sac: Absent

Yolk sac:  Absent

Embryo:  Absent

Subchorionic hemorrhage:  None visualized.

Maternal uterus/adnexae: The thick-walled cystic structure measuring
2 cm within the RIGHT ovary most consistent with corpus luteal cyst.
Normal LEFT ovary. Endometrium is thickened. No free fluid.
IMPRESSION: Probable early intrauterine gestational sac, but no yolk sac, fetal
pole, or cardiac activity yet visualized. Recommend follow-up
quantitative B-HCG levels and follow-up US in 14 days to assess
viability. This recommendation follows SRU consensus guidelines:
Diagnostic Criteria for Nonviable Pregnancy Early in the First
Trimester. N Engl J Med 0737; [DATE].

## 2021-05-14 DIAGNOSIS — O039 Complete or unspecified spontaneous abortion without complication: Secondary | ICD-10-CM | POA: Diagnosis not present

## 2021-05-17 NOTE — L&D Delivery Note (Signed)
OB/GYN Faculty Practice Delivery Note  Susan Sellers is a 31 y.o. G4P0030 s/p vag del at [redacted]w[redacted]d. She was admitted for induction due to cHTN.   ROM: 0h 19m with clear fluid GBS Status: pos Maximum Maternal Temperature: 98.3  Labor Progress: Susan Sellers was admitted on the morning of November 24th for IOL due to [facility]. She had the usual ripening measured followed by Pitocin and AROM, finally delivering using nitrous for comfort and pushing only a short period of time.  Delivery Date/Time: November 25th, 2023 at 0030 Delivery: Called to room and patient was complete and pushing. Head delivered ROA. No nuchal cord present. Shoulder and body delivered in usual fashion. Infant with spontaneous cry, placed on mother's abdomen, dried and stimulated. Cord clamped x 2 after 1-minute delay, and cut by FOB. Cord blood drawn. Placenta delivered spontaneously with gentle cord traction. Fundus firm with massage and Pitocin. Labia, perineum, vagina, and cervix inspected and found to be intact.   Placenta: spont, intact; to L&D Complications: none Lacerations: none EBL: 296cc Analgesia: nitrous  Postpartum Planning [x]  message to sent to schedule follow-up   Infant: girl  APGARs 9/9  3095g (6lb 13.2oz)  11/9, CNM  04/10/2022 12:54 AM

## 2021-08-18 ENCOUNTER — Ambulatory Visit (INDEPENDENT_AMBULATORY_CARE_PROVIDER_SITE_OTHER): Payer: BC Managed Care – PPO | Admitting: *Deleted

## 2021-08-18 VITALS — BP 129/80 | HR 94 | Ht 68.0 in | Wt 284.0 lb

## 2021-08-18 DIAGNOSIS — Z3201 Encounter for pregnancy test, result positive: Secondary | ICD-10-CM

## 2021-08-18 NOTE — Progress Notes (Signed)
Susan Sellers presents today for UPT. She has no unusual complaints. ?LMP: 07/10/21 ?   ?OBJECTIVE: Appears well, in no apparent distress.  ?OB History   ? ? Gravida  ?3  ? Para  ?1  ? Term  ?0  ? Preterm  ?1  ? AB  ?2  ? Living  ?0  ?  ? ? SAB  ?2  ? IAB  ?0  ? Ectopic  ?0  ? Multiple  ?0  ? Live Births  ?0  ?   ?  ?  ? ?Home UPT Result: positive ?In-Office UPT result: positive ?I have reviewed the patient's medical, obstetrical, social, and family histories, and medications.  ? ?ASSESSMENT: Positive pregnancy test ? ?PLAN ?Prenatal care to be completed at: [facility] ?Has PNV. Declines nausea med at this time. ?[place] Intake scheduled at checkout. ?  ?

## 2021-09-14 ENCOUNTER — Ambulatory Visit (INDEPENDENT_AMBULATORY_CARE_PROVIDER_SITE_OTHER): Payer: BC Managed Care – PPO

## 2021-09-14 ENCOUNTER — Ambulatory Visit: Payer: BC Managed Care – PPO

## 2021-09-14 VITALS — BP 130/83 | HR 93 | Ht 68.0 in | Wt 281.7 lb

## 2021-09-14 DIAGNOSIS — Z3481 Encounter for supervision of other normal pregnancy, first trimester: Secondary | ICD-10-CM

## 2021-09-14 DIAGNOSIS — O3680X0 Pregnancy with inconclusive fetal viability, not applicable or unspecified: Secondary | ICD-10-CM

## 2021-09-14 DIAGNOSIS — Z3A09 9 weeks gestation of pregnancy: Secondary | ICD-10-CM

## 2021-09-14 DIAGNOSIS — Z3490 Encounter for supervision of normal pregnancy, unspecified, unspecified trimester: Secondary | ICD-10-CM | POA: Insufficient documentation

## 2021-09-14 DIAGNOSIS — O0990 Supervision of high risk pregnancy, unspecified, unspecified trimester: Secondary | ICD-10-CM | POA: Insufficient documentation

## 2021-09-14 NOTE — Progress Notes (Cosign Needed)
New OB Intake ? ?I connected with  Ylianna Simington on 09/14/21 at  3:10 PM EDT by in person and verified that I am speaking with the correct person using two identifiers. Nurse is located at [facility] and pt is located at [place]. ? ?I discussed the limitations, risks, security and privacy concerns of performing an evaluation and management service by telephone and the availability of in person appointments. I also discussed with the patient that there may be a patient responsible charge related to this service. The patient expressed understanding and agreed to proceed. ? ?I explained I am completing New OB Intake today. We discussed her EDD of 04/16/22 that is based on LMP of 07/10/21. Pt is G4/P0030. I reviewed her allergies, medications, Medical/Surgical/OB history, and appropriate screenings. I informed her of [facility] services. Based on history, this is a/an  pregnancy complicated by Hx PROM and delivery at 18 weeks  .  ? ?Patient Active Problem List  ? Diagnosis Date Noted  ? Hypertension 04/11/2021  ? Acute blood loss anemia 04/11/2021  ? Retained products of conception after delivery without hemorrhage 04/10/2021  ? Retained placenta after delivery without hemorrhage but with other complication 04/05/2021  ? Preterm premature rupture of membranes 04/03/2021  ? BMI 50.0-59.9, adult--morbid obesity 01/05/2015  ? ? ?Concerns addressed today ? ?Delivery Plans:  ?Plans to deliver at [facility].  ? ?MyChart/Babyscripts ?MyChart access verified. I explained pt will have some visits in office and some virtually. Babyscripts instructions given and order placed. Patient verifies receipt of registration text/e-mail. Account successfully created and app downloaded. ? ?Blood Pressure Cuff  ?Patient has private insurance; instructed to purchase blood pressure cuff and bring to first prenatal appt. Explained after first prenatal appt pt will check weekly and document in Babyscripts. ? ?Weight scale: Patient does have weight scale.   ? ?Anatomy US ?Explained first scheduled [place] will be around 19 weeks. Dating and viability ?Scheduled AFP lab only appointment if [facility] pt for same day as anatomy US.  ? ?Labs ?Discussed Avelina Laine genetic screening with patient. Would like both Panorama and Horizon drawn at new OB visit.Also if interested in genetic testing, tell patient she will need AFP 15-21 weeks to complete genetic testing .Routine prenatal labs needed. ? ?Covid Vaccine ?Patient has not covid vaccine.  ?  ?Is patient interested in [place]? No  "Interested in [employer] - Schedule next visit with CNM" on sticky note ? ?Informed patient of [facility] website  and placed link in her AVS.  ? ?Social Determinants of Health ?Food Insecurity: Patient denies food insecurity. ?WIC Referral: Patient is interested in referral to [facility].  ?Transportation: Patient denies transportation needs. ?Childcare: Discussed no children allowed at ultrasound appointments. Offered childcare services; patient declines childcare services at this time. ? ?Send link to Pregnancy Navigators ? ? ?Placed OB Box on problem list and updated ? ?First visit review ?I reviewed new OB appt with pt. I explained she will have a pelvic exam, ob bloodwork with genetic screening, and PAP smear. Explained pt will be seen by Nettie Elm at first visit; encounter routed to appropriate provider. Explained that patient will be seen by pregnancy navigator following visit with provider. [facility] information placed in AVS.  ? ?Hamilton Capri, RN ?09/14/2021  3:04 PM  ?

## 2021-09-30 ENCOUNTER — Ambulatory Visit (INDEPENDENT_AMBULATORY_CARE_PROVIDER_SITE_OTHER): Payer: BC Managed Care – PPO | Admitting: Obstetrics & Gynecology

## 2021-09-30 ENCOUNTER — Other Ambulatory Visit (HOSPITAL_COMMUNITY)
Admission: RE | Admit: 2021-09-30 | Discharge: 2021-09-30 | Disposition: A | Discharge status: Home / Self Care | Payer: BC Managed Care – PPO | Source: Ambulatory Visit | Attending: Obstetrics and Gynecology | Admitting: Obstetrics and Gynecology

## 2021-09-30 ENCOUNTER — Encounter: Payer: Self-pay | Admitting: Obstetrics & Gynecology

## 2021-09-30 VITALS — BP 117/77 | HR 62 | Wt 277.0 lb

## 2021-09-30 DIAGNOSIS — O99210 Obesity complicating pregnancy, unspecified trimester: Secondary | ICD-10-CM

## 2021-09-30 DIAGNOSIS — O26891 Other specified pregnancy related conditions, first trimester: Secondary | ICD-10-CM | POA: Diagnosis not present

## 2021-09-30 DIAGNOSIS — N898 Other specified noninflammatory disorders of vagina: Secondary | ICD-10-CM

## 2021-09-30 DIAGNOSIS — R87619 Unspecified abnormal cytological findings in specimens from cervix uteri: Secondary | ICD-10-CM

## 2021-09-30 DIAGNOSIS — O10919 Unspecified pre-existing hypertension complicating pregnancy, unspecified trimester: Secondary | ICD-10-CM

## 2021-09-30 DIAGNOSIS — B3731 Acute candidiasis of vulva and vagina: Secondary | ICD-10-CM

## 2021-09-30 DIAGNOSIS — O219 Vomiting of pregnancy, unspecified: Secondary | ICD-10-CM

## 2021-09-30 DIAGNOSIS — O0991 Supervision of high risk pregnancy, unspecified, first trimester: Secondary | ICD-10-CM | POA: Diagnosis not present

## 2021-09-30 DIAGNOSIS — Z3481 Encounter for supervision of other normal pregnancy, first trimester: Secondary | ICD-10-CM | POA: Diagnosis not present

## 2021-09-30 DIAGNOSIS — Z3A11 11 weeks gestation of pregnancy: Secondary | ICD-10-CM

## 2021-09-30 DIAGNOSIS — O09291 Supervision of pregnancy with other poor reproductive or obstetric history, first trimester: Secondary | ICD-10-CM

## 2021-09-30 DIAGNOSIS — Z31430 Encounter of female for testing for genetic disease carrier status for procreative management: Secondary | ICD-10-CM | POA: Diagnosis not present

## 2021-09-30 MED ORDER — ASPIRIN EC 81 MG PO TBEC: 81.0000 mg | DELAYED_RELEASE_TABLET | Freq: Every day | ORAL | 2 refills | Status: DC

## 2021-09-30 MED ORDER — ONDANSETRON 4 MG PO TBDP: 4.0000 mg | ORAL_TABLET | Freq: Four times a day (QID) | ORAL | 0 refills | Status: DC | PRN

## 2021-09-30 MED ORDER — PROMETHAZINE HCL 25 MG PO TABS: 25.0000 mg | ORAL_TABLET | Freq: Four times a day (QID) | ORAL | 2 refills | Status: DC | PRN

## 2021-09-30 NOTE — Progress Notes (Signed)
? ?History:  ? Susan Sellers is a 31 y.o. G4P0030 at [redacted]w[redacted]d by LMP being seen today for her first obstetrical visit.  Her obstetrical history is significant for  three miscarriages; had PPROM in last pregnancy at 18 weeks and delivery was complicated by infected retained products for which she received misoprostol and antibiotics as she adamantly refused D&E . Other miscarriages were much earlier, no evidence of cervical incompetence.  Also has HTN, was noted early during last pregnancy. Patient does intend to breast feed. Pregnancy history fully reviewed. ? ?Patient reports vaginal discharge for which she wants evaluation. Also complains of nausea and she wants medication for this.  Accompanied by her FOB. ?  ? ?  ?HISTORY: ?OB History  ?Gravida Para Term Preterm AB Living  ?4 0 0 0 3 0  ?SAB IAB Ectopic Multiple Live Births  ?2 0 0 0 0  ?  ?# Outcome Date GA Lbr Len/2nd Weight Sex Delivery Anes PTL Lv  ?4 Current           ?3 AB 04/05/21 [redacted]w[redacted]d   M Vag-Spont   FD  ?2 SAB           ?1 SAB           ?Last pap smear was done 02/02/2021 and was abnormal - unknown type but reports having normal colposcopy. ? ?Past Medical History:  ?Diagnosis Date  ? Hypertension   ? ?Past Surgical History:  ?Procedure Laterality Date  ? WISDOM TOOTH EXTRACTION    ? ?Family History  ?Problem Relation Age of Onset  ? Heart disease Mother   ? Hypertension Mother   ? Hypertension Maternal Grandmother   ? Hyperlipidemia Paternal Grandmother   ? Kidney failure Paternal Grandmother   ? ?Social History  ? ?Tobacco Use  ? Smoking status: Never  ? Smokeless tobacco: Never  ?Vaping Use  ? Vaping Use: Never used  ?Substance Use Topics  ? Alcohol use: Not Currently  ?  Alcohol/week: 0.0 standard drinks  ? Drug use: Not Currently  ?  Types: Marijuana  ?  Comment: last used Nov 2022  ? ?No Known Allergies ?Current Outpatient Medications on File Prior to Visit  ?Medication Sig Dispense Refill  ? Prenatal Vit-Fe Fumarate-FA (MULTIVITAMIN-PRENATAL)  27-0.8 MG TABS tablet Take 1 tablet by mouth daily at 12 noon.    ? acetaminophen (TYLENOL) 325 MG tablet Take 2 tablets (650 mg total) by mouth every 4 (four) hours as needed (for pain scale < 4  OR  temperature  >/=  100.5 F).    ? ?No current facility-administered medications on file prior to visit.  ? ? ?Review of Systems ?Pertinent items noted in HPI and remainder of comprehensive ROS otherwise negative. ? ?Physical Exam:  ? ?Vitals:  ? 09/30/21 1521  ?BP: 117/77  ?Pulse: 62  ?Weight: 277 lb (125.6 kg)  ? ?Fetal Heart Rate (bpm): 160   ?General: well-developed, well-nourished female in no acute distress  ?Breasts:  normal appearance, no masses or tenderness bilaterally, exam done in the presence of a chaperone.   ?Skin: normal coloration and turgor, no rashes  ?Neurologic: oriented, normal, negative, normal mood  ?Extremities: normal strength, tone, and muscle mass, ROM of all joints is normal  ?HEENT PERRLA, extraocular movement intact and sclera clear, anicteric  ?Neck supple and no masses  ?Cardiovascular: regular rate and rhythm  ?Respiratory:  no respiratory distress, normal breath sounds  ?Abdomen: soft, non-tender; bowel sounds normal; no masses,  no organomegaly  ?  Pelvic: deferred  ?  ?Assessment:  ?  ?Pregnancy: G4P0030 ?Patient Active Problem List  ? Diagnosis Date Noted  ? Maternal morbid obesity, antepartum (HCC) 09/30/2021  ? Chronic hypertension affecting pregnancy 09/30/2021  ? Abnormal cervical Papanicolaou smear on 02/02/2021, normal colposcopy 09/30/2021  ? Supervision of high-risk pregnancy 09/14/2021  ? Hypertension 04/11/2021  ? History of preterm premature rupture of membranes (PPROM) in previous pregnancy at 18 weeks, currently pregnant 04/03/2021  ? BMI 50.0-59.9, adult--morbid obesity 01/05/2015  ? ?  ?Plan:  ?  ?1. Chronic hypertension affecting pregnancy ?Discussed implications of CHTN in pregnancy, need for antenatal testing and frequent ultrasounds/prenatal visits, need for  optimizing BP control to decrease CHTN/preeclampsia associated maternal-fetal morbidity and mortality. Will monitor BP.  ASA started. Will check baseline labs today. Referred to Cardio Obstetrics. ?- Comprehensive metabolic panel ?- aspirin EC 81 MG tablet; Take 1 tablet (81 mg total) by mouth daily. Take after 12 weeks for prevention of preeclampsia later in pregnancy  Dispense: 300 tablet; Refill: 2 ?- [place] MFM OB DETAIL +14 WK; Future ?- Protein / creatinine ratio, urine ?- AMB Referral to Cardio Obstetrics ? ?2. Maternal morbid obesity, antepartum (HCC) ?Recommended TWG 11-20 lbs. Nutrition referral done.  Baseline labs to be checked.  ?- HgB A1c ?- TSH Rfx on Abnormal to Free T4 ?- Comprehensive metabolic panel ?- aspirin EC 81 MG tablet; Take 1 tablet (81 mg total) by mouth daily. Take after 12 weeks for prevention of preeclampsia later in pregnancy  Dispense: 300 tablet; Refill: 2 ?- [place] MFM OB DETAIL +14 WK; Future ?- Protein / creatinine ratio, urine ?- Amb Referral to Nutrition and Diabetic Education ?- AMB Referral to Cardio Obstetrics ? ?3. History of preterm premature rupture of membranes (PROM) in previous pregnancy, currently pregnant in first trimester ?No evidence of cervical incompetence.  Will continue to monitor. ? ?4. Abnormal cervical Papanicolaou smear on 02/02/2021, normal colposcopy ?Await records from [place], likely needs repeat pap in 01/2022 ? ?5. Nausea and vomiting during pregnancy ?Antiemetics prescribed. ?- promethazine (PHENERGAN) 25 MG tablet; Take 1 tablet (25 mg total) by mouth every 6 (six) hours as needed for nausea or vomiting.  Dispense: 30 tablet; Refill: 2 ?- ondansetron (ZOFRAN-ODT) 4 MG disintegrating tablet; Take 1 tablet (4 mg total) by mouth every 6 (six) hours as needed for nausea.  Dispense: 20 tablet; Refill: 0 ? ?6. Vaginal discharge during pregnancy in first trimester ?- Cervicovaginal ancillary only( [facility]) done, will follow up results and manage  accordingly. ? ?7. [redacted] weeks gestation of pregnancy ?8. Supervision of high risk pregnancy in first trimester ?- Culture, OB Urine ?- CBC/D/Plt+RPR+Rh+ABO+RubIgG... ?- Genetic Screening ?- HgB A1c ?- Comprehensive metabolic panel ?- aspirin EC 81 MG tablet; Take 1 tablet (81 mg total) by mouth daily. Take after 12 weeks for prevention of preeclampsia later in pregnancy  Dispense: 300 tablet; Refill: 2 ?- [place] MFM OB DETAIL +14 WK; Future ?- Enroll Patient in PreNatal Babyscripts ? ?Initial labs drawn. ?Continue prenatal vitamins. ?Problem list reviewed and updated. ?Genetic Screening discussed, Panorama and Horizon: ordered. ?Ultrasound discussed; fetal anatomic survey: ordered. ?Anticipatory guidance about prenatal visits given including labs, ultrasounds, and testing. ?Discussed usage of the Babyscripts app for more information about pregnancy, and to track blood pressures. ?Also discussed usage of virtual visits as additional source of managing and completing prenatal visits.  ?Patient was encouraged to use MyChart to review results, send requests, and have questions addressed.   ?The nature of  -  [facility] with multiple MDs and Advanced Practice Providers was explained to patient; also emphasized that residents, students are part of our team. ?Routine obstetric precautions reviewed. Encouraged to seek out care at office or emergency room [facility] preferred) for urgent and/or emergent concerns. ?Return in about 4 weeks (around 10/28/2021) for OFFICE OB VISIT (MD only).  ?  ? ?Jaynie CollinsUGONNA  Brianda Beitler, MD, FACOG ?Obstetrician Heritage manager& Gynecologist, Faculty Practice ?[facility], [facility] ?

## 2021-10-01 LAB — COMPREHENSIVE METABOLIC PANEL
ALT: 9 IU/L (ref 0–32)
AST: 12 IU/L (ref 0–40)
Albumin/Globulin Ratio: 1.3 (ref 1.2–2.2)
Albumin: 3.9 g/dL (ref 3.9–5.0)
Alkaline Phosphatase: 65 IU/L (ref 44–121)
BUN/Creatinine Ratio: 8 — ABNORMAL LOW (ref 9–23)
BUN: 5 mg/dL — ABNORMAL LOW (ref 6–20)
Bilirubin Total: 0.5 mg/dL (ref 0.0–1.2)
CO2: 22 mmol/L (ref 20–29)
Calcium: 9.6 mg/dL (ref 8.7–10.2)
Chloride: 99 mmol/L (ref 96–106)
Creatinine, Ser: 0.62 mg/dL (ref 0.57–1.00)
Globulin, Total: 3.1 g/dL (ref 1.5–4.5)
Glucose: 95 mg/dL (ref 70–99)
Potassium: 3.6 mmol/L (ref 3.5–5.2)
Sodium: 135 mmol/L (ref 134–144)
Total Protein: 7 g/dL (ref 6.0–8.5)
eGFR: 124 mL/min/{1.73_m2} (ref >59)

## 2021-10-01 LAB — CERVICOVAGINAL ANCILLARY ONLY
Bacterial Vaginitis (gardnerella): NEGATIVE
Candida Glabrata: NEGATIVE
Candida Vaginitis: POSITIVE — AB
Chlamydia: NEGATIVE
Comment: NEGATIVE
Comment: NEGATIVE
Comment: NEGATIVE
Comment: NEGATIVE
Comment: NEGATIVE
Comment: NORMAL
Neisseria Gonorrhea: NEGATIVE
Trichomonas: NEGATIVE

## 2021-10-01 LAB — CBC/D/PLT+RPR+RH+ABO+RUBIGG...
Antibody Screen: NEGATIVE
Basophils Absolute: 0.1 10*3/uL (ref 0.0–0.2)
Basos: 0 %
EOS (ABSOLUTE): 0.1 10*3/uL (ref 0.0–0.4)
Eos: 1 %
HCV Ab: NONREACTIVE
HIV Screen 4th Generation wRfx: NONREACTIVE
Hematocrit: 33.9 % — ABNORMAL LOW (ref 34.0–46.6)
Hemoglobin: 11.1 g/dL (ref 11.1–15.9)
Hepatitis B Surface Ag: NEGATIVE
Immature Grans (Abs): 0 10*3/uL (ref 0.0–0.1)
Immature Granulocytes: 0 %
Lymphocytes Absolute: 2.5 10*3/uL (ref 0.7–3.1)
Lymphs: 20 %
MCH: 25.6 pg — ABNORMAL LOW (ref 26.6–33.0)
MCHC: 32.7 g/dL (ref 31.5–35.7)
MCV: 78 fL — ABNORMAL LOW (ref 79–97)
Monocytes Absolute: 1 10*3/uL — ABNORMAL HIGH (ref 0.1–0.9)
Monocytes: 8 %
Neutrophils Absolute: 8.7 10*3/uL — ABNORMAL HIGH (ref 1.4–7.0)
Neutrophils: 71 %
Platelets: 319 10*3/uL (ref 150–450)
RBC: 4.33 x10E6/uL (ref 3.77–5.28)
RDW: 15.1 % (ref 11.7–15.4)
RPR Ser Ql: NONREACTIVE
Rh Factor: POSITIVE
Rubella Antibodies, IGG: 1.53 index (ref >0.99)
WBC: 12.5 10*3/uL — ABNORMAL HIGH (ref 3.4–10.8)

## 2021-10-01 LAB — HCV INTERPRETATION

## 2021-10-01 LAB — PROTEIN / CREATININE RATIO, URINE
Creatinine, Urine: 360.3 mg/dL
Protein, Ur: 23.3 mg/dL
Protein/Creat Ratio: 65 mg/g creat (ref 0–200)

## 2021-10-01 LAB — HEMOGLOBIN A1C
Est. average glucose Bld gHb Est-mCnc: 111 mg/dL
Hgb A1c MFr Bld: 5.5 % (ref 4.8–5.6)

## 2021-10-01 LAB — TSH RFX ON ABNORMAL TO FREE T4: TSH: 0.681 u[IU]/mL (ref 0.450–4.500)

## 2021-10-02 LAB — URINE CULTURE, OB REFLEX

## 2021-10-02 LAB — CULTURE, OB URINE

## 2021-10-03 MED ORDER — TERCONAZOLE 0.8 % VA CREA: 1.0000 | TOPICAL_CREAM | Freq: Every day | VAGINAL | 0 refills | Status: DC

## 2021-10-03 NOTE — Addendum Note (Signed)
Addended by: Jaynie Collins A on: 10/03/2021 10:54 AM   Modules accepted: Orders

## 2021-10-07 ENCOUNTER — Encounter: Payer: Self-pay | Admitting: Obstetrics & Gynecology

## 2021-10-13 ENCOUNTER — Encounter: Payer: Self-pay | Admitting: Obstetrics and Gynecology

## 2021-10-13 ENCOUNTER — Encounter: Payer: Self-pay | Admitting: Obstetrics & Gynecology

## 2021-10-13 DIAGNOSIS — D563 Thalassemia minor: Secondary | ICD-10-CM | POA: Insufficient documentation

## 2021-10-13 HISTORY — DX: Thalassemia minor: D56.3

## 2021-10-14 ENCOUNTER — Telehealth: Payer: Self-pay | Admitting: *Deleted

## 2021-10-14 NOTE — Telephone Encounter (Signed)
Call to pt to discuss Horizon screening. Pt made aware of results and answered questions.  Pt was offered GC referral- declined at this time.  Pt states she will discuss with provider at next appt.

## 2021-10-16 ENCOUNTER — Encounter: Payer: Self-pay | Admitting: Cardiology

## 2021-10-16 ENCOUNTER — Ambulatory Visit (INDEPENDENT_AMBULATORY_CARE_PROVIDER_SITE_OTHER): Payer: BC Managed Care – PPO | Admitting: Cardiology

## 2021-10-16 VITALS — BP 130/86 | HR 83 | Ht 68.0 in | Wt 273.0 lb

## 2021-10-16 DIAGNOSIS — Z8249 Family history of ischemic heart disease and other diseases of the circulatory system: Secondary | ICD-10-CM

## 2021-10-16 DIAGNOSIS — I498 Other specified cardiac arrhythmias: Secondary | ICD-10-CM

## 2021-10-16 DIAGNOSIS — I10 Essential (primary) hypertension: Secondary | ICD-10-CM

## 2021-10-16 DIAGNOSIS — E669 Obesity, unspecified: Secondary | ICD-10-CM | POA: Diagnosis not present

## 2021-10-16 NOTE — Patient Instructions (Addendum)
Medication Instructions:  Your physician recommends that you continue on your current medications as directed. Please refer to the Current Medication list given to you today. Dr Servando Salina wants you to take your aspirin 81 mg daily.   *If you need a refill on your cardiac medications before your next appointment, please call your pharmacy*   Lab Work: NONE If you have labs (blood work) drawn today and your tests are completely normal, you will receive your results only by: MyChart Message (if you have MyChart) OR A paper copy in the mail If you have any lab test that is abnormal or we need to change your treatment, we will call you to review the results.   Testing/Procedures: NONE   Follow-Up: At [facility], you and your health needs are our priority.  As part of our continuing mission to provide you with exceptional heart care, we have created designated Provider Care Teams.  These Care Teams include your primary Cardiologist (physician) and Advanced Practice Providers (APPs -  Physician Assistants and Nurse Practitioners) who all work together to provide you with the care you need, when you need it.  We recommend signing up for the patient portal called "MyChart".  Sign up information is provided on this After Visit Summary.  MyChart is used to connect with patients for Virtual Visits (Telemedicine).  Patients are able to view lab/test results, encounter notes, upcoming appointments, etc.  Non-urgent messages can be sent to your provider as well.   To learn more about what you can do with MyChart, go to [email or web address].    Your next appointment:   12 week(s)  The format for your next appointment:   In Person  Provider:   Thomasene Ripple, DO  [address] [place], [address]    Other Instructions Please take your blood pressure daily   HOW TO TAKE YOUR BLOOD PRESSURE: Rest 5 minutes before taking your blood pressure. Don't smoke or drink caffeinated  beverages for at least 30 minutes before. Take your blood pressure before (not after) you eat. Sit comfortably with your back supported and both feet on the floor (don't cross your legs). Elevate your arm to heart level on a table or a desk. Use the proper sized cuff. It should fit smoothly and snugly around your bare upper arm. There should be enough room to slip a fingertip under the cuff. The bottom edge of the cuff should be 1 inch above the crease of the elbow. Ideally, take 3 measurements at one sitting and record the average.   Important Information About Sugar

## 2021-10-16 NOTE — Progress Notes (Signed)
Cardio-Obstetrics Clinic  {Choose New Eval or Follow Up Note:[id number]}   Prior CV Studies Reviewed: The following studies were reviewed today: ***  Past Medical History:  Diagnosis Date   Hypertension     Past Surgical History:  Procedure Laterality Date   WISDOM TOOTH EXTRACTION     { Click here to update PMH, PSH, OB Hx then refresh note  :1}   OB History     Gravida  4   Para  0   Term  0   Preterm  0   AB  3   Living  0      SAB  2   IAB  0   Ectopic  0   Multiple  0   Live Births  0           { Click here to update OB Charting then refresh note  :1}    Current Medications: Current Meds  Medication Sig   acetaminophen (TYLENOL) 325 MG tablet Take 2 tablets (650 mg total) by mouth every 4 (four) hours as needed (for pain scale < 4  OR  temperature  >/=  100.5 F).   ondansetron (ZOFRAN-ODT) 4 MG disintegrating tablet Take 1 tablet (4 mg total) by mouth every 6 (six) hours as needed for nausea.   Prenatal Vit-Fe Fumarate-FA (MULTIVITAMIN-PRENATAL) 27-0.8 MG TABS tablet Take 1 tablet by mouth daily at 12 noon.   promethazine (PHENERGAN) 25 MG tablet Take 1 tablet (25 mg total) by mouth every 6 (six) hours as needed for nausea or vomiting.   terconazole (TERAZOL 3) 0.8 % vaginal cream Place 1 applicator vaginally at bedtime. Apply nightly for three nights.     Allergies:   Patient has no known allergies.   Social History   Socioeconomic History   Marital status: Single    Spouse name: Not on file   Number of children: Not on file   Years of education: Not on file   Highest education level: Not on file  Occupational History   Not on file  Tobacco Use   Smoking status: Never   Smokeless tobacco: Never  Vaping Use   Vaping Use: Never used  Substance and Sexual Activity   Alcohol use: Not Currently    Alcohol/week: 0.0 standard drinks   Drug use: Not Currently    Types: Marijuana    Comment: last used Nov 2022   Sexual activity:  Yes    Partners: Male    Birth control/protection: None  Other Topics Concern   Not on file  Social History Narrative   Not on file   Social Determinants of Health   Financial Resource Strain: Not on file  Food Insecurity: Not on file  Transportation Needs: Not on file  Physical Activity: Not on file  Stress: Not on file  Social Connections: Not on file  { Click here to update SDOH then refresh :1}    Family History  Problem Relation Age of Onset   Heart disease Mother    Hypertension Mother    Hypertension Maternal Grandmother    Hyperlipidemia Paternal Grandmother    Kidney failure Paternal Grandmother    { Click here to update FH then refresh note    :1}   ROS:   Please see the history of present illness.    *** All other systems reviewed and are negative.   Labs/EKG Reviewed:    EKG:   EKG is *** ordered today.  The ekg ordered today demonstrates ***  Recent Labs: 09/30/2021: ALT 9; BUN 5; Creatinine, Ser 0.62; Hemoglobin 11.1; Platelets 319; Potassium 3.6; Sodium 135; TSH 0.681   Recent Lipid Panel No results found for: CHOL, TRIG, HDL, CHOLHDL, LDLCALC, LDLDIRECT  Physical Exam:    VS:  BP 130/86 (BP Location: Right Arm, Patient Position: Sitting, Cuff Size: Large)   Pulse 83   Ht 5\' 8"  (1.727 m)   Wt 273 lb (123.8 kg)   LMP 07/10/2021 (Exact Date)   SpO2 95%   BMI 41.51 kg/m     Wt Readings from Last 3 Encounters:  10/16/21 273 lb (123.8 kg)  09/30/21 277 lb (125.6 kg)  09/14/21 281 lb 11.2 oz (127.8 kg)     GEN: *** Well nourished, well developed in no acute distress HEENT: Normal NECK: No JVD; No carotid bruits LYMPHATICS: No lymphadenopathy CARDIAC: ***RRR, no murmurs, rubs, gallops RESPIRATORY:  Clear to auscultation without rales, wheezing or rhonchi  ABDOMEN: Soft, non-tender, non-distended MUSCULOSKELETAL:  No edema; No deformity  SKIN: Warm and dry NEUROLOGIC:  Alert and oriented x 3 PSYCHIATRIC:  Normal affect    Risk  Assessment/Risk Calculators:   { Click to calculate CARPREG II - THEN refresh note :1}    { Click to caclulate Mod WHO Class of CV Risk - THEN refresh note :1}     { Click for CHADS2VASc Score - THEN Refresh Note    :11/14/21      ASSESSMENT & PLAN:    *** There are no Patient Instructions on file for this visit.   Dispo:  No follow-ups on file.   Medication Adjustments/Labs and Tests Ordered: Current medicines are reviewed at length with the patient today.  Concerns regarding medicines are outlined above.  Tests Ordered: No orders of the defined types were placed in this encounter.  Medication Changes: No orders of the defined types were placed in this encounter.

## 2021-10-28 ENCOUNTER — Encounter: Payer: Self-pay | Admitting: Obstetrics and Gynecology

## 2021-10-28 ENCOUNTER — Ambulatory Visit (INDEPENDENT_AMBULATORY_CARE_PROVIDER_SITE_OTHER): Payer: BC Managed Care – PPO | Admitting: Obstetrics and Gynecology

## 2021-10-28 VITALS — BP 112/75 | HR 92 | Wt 272.0 lb

## 2021-10-28 DIAGNOSIS — O10919 Unspecified pre-existing hypertension complicating pregnancy, unspecified trimester: Secondary | ICD-10-CM

## 2021-10-28 DIAGNOSIS — Z3A15 15 weeks gestation of pregnancy: Secondary | ICD-10-CM | POA: Diagnosis not present

## 2021-10-28 DIAGNOSIS — O09292 Supervision of pregnancy with other poor reproductive or obstetric history, second trimester: Secondary | ICD-10-CM

## 2021-10-28 DIAGNOSIS — O09892 Supervision of other high risk pregnancies, second trimester: Secondary | ICD-10-CM | POA: Diagnosis not present

## 2021-10-28 DIAGNOSIS — O0990 Supervision of high risk pregnancy, unspecified, unspecified trimester: Secondary | ICD-10-CM

## 2021-10-28 DIAGNOSIS — Z36 Encounter for antenatal screening for chromosomal anomalies: Secondary | ICD-10-CM | POA: Diagnosis not present

## 2021-10-28 NOTE — Progress Notes (Signed)
Subjective:  Susan Sellers is a 31 y.o. G4P0030 at [redacted]w[redacted]d being seen today for ongoing prenatal care.  She is currently monitored for the following issues for this high-risk pregnancy and has BMI 50.0-59.9, adult--morbid obesity; History of preterm premature rupture of membranes (PPROM) in previous pregnancy at 10 weeks, currently pregnant; Hypertension; Supervision of high-risk pregnancy; Maternal morbid obesity, antepartum ([place]); Chronic hypertension affecting pregnancy; Abnormal cervical Papanicolaou smear on 02/02/2021, normal colposcopy; and Alpha thalassemia silent carrier on their problem list.  Patient reports no complaints.  Contractions: Not present. Vag. Bleeding: None.   . Denies leaking of fluid.   The following portions of the patient's history were reviewed and updated as appropriate: allergies, current medications, past family history, past medical history, past social history, past surgical history and problem list. Problem list updated.  Objective:   Vitals:   10/28/21 0844  BP: 112/75  Pulse: 92  Weight: 272 lb (123.4 kg)    Fetal Status: Fetal Heart Rate (bpm): 153         General:  Alert, oriented and cooperative. Patient is in no acute distress.  Skin: Skin is warm and dry. No rash noted.   Cardiovascular: Normal heart rate noted  Respiratory: Normal respiratory effort, no problems with respiration noted  Abdomen: Soft, gravid, appropriate for gestational age. Pain/Pressure: Absent     Pelvic:  Cervical exam deferred        Extremities: Normal range of motion.  Edema: None  Mental Status: Normal mood and affect. Normal behavior. Normal judgment and thought content.   Urinalysis:      Assessment and Plan:  Pregnancy: G4P0030 at [redacted]w[redacted]d  1. Supervision of high risk pregnancy, antepartum Stable Anatomy scan scheduled AFP today  2. Chronic hypertension affecting pregnancy Stable No meds Has seen OB cards Continue with qd BASA  3. History of preterm premature  rupture of membranes (PROM) in previous pregnancy, currently pregnant in second trimester Stable  Preterm labor symptoms and general obstetric precautions including but not limited to vaginal bleeding, contractions, leaking of fluid and fetal movement were reviewed in detail with the patient. Please refer to After Visit Summary for other counseling recommendations.  Return in about 4 weeks (around 11/25/2021) for OB visit, face to face, MD only.   Chancy Milroy, MD

## 2021-10-28 NOTE — Patient Instructions (Signed)

## 2021-10-28 NOTE — Progress Notes (Signed)
ROB/AFP 

## 2021-10-30 LAB — AFP, SERUM, OPEN SPINA BIFIDA
AFP MoM: 0.92
AFP Value: 23.1 ng/mL
Gest. Age on Collection Date: 15.5 weeks
Maternal Age At EDD: 29.4 yr
OSBR Risk 1 IN: 10000
Test Results:: NEGATIVE
Weight: 272 [lb_av]

## 2021-11-23 ENCOUNTER — Ambulatory Visit (HOSPITAL_BASED_OUTPATIENT_CLINIC_OR_DEPARTMENT_OTHER): Payer: BC Managed Care – PPO | Admitting: Maternal & Fetal Medicine

## 2021-11-23 ENCOUNTER — Ambulatory Visit: Payer: BC Managed Care – PPO | Attending: Obstetrics & Gynecology

## 2021-11-23 ENCOUNTER — Other Ambulatory Visit: Payer: Self-pay | Admitting: *Deleted

## 2021-11-23 ENCOUNTER — Other Ambulatory Visit: Payer: Self-pay | Admitting: Obstetrics & Gynecology

## 2021-11-23 ENCOUNTER — Ambulatory Visit: Payer: BC Managed Care – PPO | Admitting: *Deleted

## 2021-11-23 ENCOUNTER — Encounter: Payer: Self-pay | Admitting: *Deleted

## 2021-11-23 VITALS — BP 135/77 | HR 100

## 2021-11-23 DIAGNOSIS — O3432 Maternal care for cervical incompetence, second trimester: Secondary | ICD-10-CM | POA: Diagnosis not present

## 2021-11-23 DIAGNOSIS — O0990 Supervision of high risk pregnancy, unspecified, unspecified trimester: Secondary | ICD-10-CM

## 2021-11-23 DIAGNOSIS — Z3A11 11 weeks gestation of pregnancy: Secondary | ICD-10-CM | POA: Diagnosis not present

## 2021-11-23 DIAGNOSIS — O09292 Supervision of pregnancy with other poor reproductive or obstetric history, second trimester: Secondary | ICD-10-CM | POA: Insufficient documentation

## 2021-11-23 DIAGNOSIS — O0991 Supervision of high risk pregnancy, unspecified, first trimester: Secondary | ICD-10-CM

## 2021-11-23 DIAGNOSIS — O10919 Unspecified pre-existing hypertension complicating pregnancy, unspecified trimester: Secondary | ICD-10-CM

## 2021-11-23 DIAGNOSIS — Z363 Encounter for antenatal screening for malformations: Secondary | ICD-10-CM | POA: Diagnosis not present

## 2021-11-23 DIAGNOSIS — Z3A20 20 weeks gestation of pregnancy: Secondary | ICD-10-CM

## 2021-11-23 DIAGNOSIS — Z3686 Encounter for antenatal screening for cervical length: Secondary | ICD-10-CM

## 2021-11-23 DIAGNOSIS — O10912 Unspecified pre-existing hypertension complicating pregnancy, second trimester: Secondary | ICD-10-CM | POA: Diagnosis not present

## 2021-11-23 DIAGNOSIS — Z3A19 19 weeks gestation of pregnancy: Secondary | ICD-10-CM | POA: Diagnosis not present

## 2021-11-23 DIAGNOSIS — D563 Thalassemia minor: Secondary | ICD-10-CM

## 2021-11-23 DIAGNOSIS — O99210 Obesity complicating pregnancy, unspecified trimester: Secondary | ICD-10-CM | POA: Diagnosis not present

## 2021-11-23 NOTE — Progress Notes (Signed)
MFM Consultation   Susan Sellers is a 31 yo G4P0 who is seen today for a detailed anatomy and to assess the cervical length given her history of a 18 week loss for suspected cervical insufficiency.  She is seen today at the request of Dr. Jaynie Collins.  Susan Sellers is overall doing well without complaints. She denies s/s of preterm labor.  She has a low risk NIPS/neg AFP and Horizon.  A single intrauterine pregnancy was observed. Normal anatomy with measurements consistent with dates. There is good fetal movement and amniotic fluid. Suboptimal views of the fetal anatomy was observed secondary to fetal position and maternal habitus.  Isolated bilateral choroid plexus cyst were observed and in the setting of low risk NIPS no further testing was recommended.   In addition, we observed cervical funneling to the external os, with mild amount of debris at the external os.   A sterile speculum exam was performed demonstrating a normal appearing cervix that was fingertip in dilation. The fetal membranes were not visualized.  I discussed with Susan Sellers the evaluation and management of cervical incompetence. Defined as painless cervical shortening and dilation.  I recommended that a transvaginal cerclage be placed.  I discussed the patient with Dr. Catalina Antigua. She scheduled Susan Sellers for a ultrasound indicated cerclage on 7/11 at 1 pm.  Labor and cervical dilations precautions were provided.  She is scheduled to have a CL measurement in 2 weeks and repeat growth in 4 weeks.  In addition, nightly vaginal progesterone should be provided after cerclage placement.   Lastly, if she were to pursue future pregnancies she should have  history indicated cerclage at 13 weeks.  All questions answered.  I spent 45 minutes with > 50% in face to face consultation and care coordination.  Novella Olive, MD.

## 2021-11-23 NOTE — H&P (Signed)
Ladene Allocca is a 31 y.o. female G4P0030 at [redacted]w[redacted]d presenting for scheduled rescue cerclage. Patient was seen for anatomy ultrasound yesterday and was found to have a shorten cervix. Patient was examined by Dr. Grace Bushy and was noted to have a cervical exam of fingertip and short. Patient reports feeling well and denies contractions or abnormal discharge/vaginal bleeding. She has a history of 18 week PPROM and fetal loss with her last pregnancy pregnancy. Patient with prenatal care at [facility] also complicated by [facility] without medication and maternal obesity.    OB History     Gravida  4   Para  0   Term  0   Preterm  0   AB  3   Living  0      SAB  2   IAB  0   Ectopic  0   Multiple  0   Live Births  0          Past Medical History:  Diagnosis Date   Hypertension    Past Surgical History:  Procedure Laterality Date   WISDOM TOOTH EXTRACTION     Family History: family history includes Heart disease in her mother; Hyperlipidemia in her paternal grandmother; Hypertension in her maternal grandmother and mother; Kidney failure in her paternal grandmother. Social History:  reports that she has never smoked. She has never used smokeless tobacco. She reports that she does not currently use alcohol. She reports that she does not currently use drugs after having used the following drugs: Marijuana.     Maternal Diabetes: No A1C 5.5 Genetic Screening: Normal Maternal Ultrasounds/Referrals: Normal Fetal Ultrasounds or other Referrals:  None Maternal Substance Abuse:  No Significant Maternal Medications:  None Significant Maternal Lab Results:  None Other Comments:  None  Review of Systems See pertinent in HPI History   Blood pressure (!) 146/79, pulse 100, temperature 98.2 F (36.8 C), temperature source Oral, resp. rate 20, height 5\' 8"  (1.727 m), weight 123.4 kg, last menstrual period 07/10/2021, SpO2 99 %, unknown if currently breastfeeding. Exam Physical Exam   GENERAL: Well-developed, well-nourished female in no acute distress.  LUNGS: Clear to auscultation bilaterally.  HEART: Regular rate and rhythm. ABDOMEN: Soft, nontender, gravid PELVIC: Deferred to OR EXTREMITIES: No cyanosis, clubbing, or edema, 2+ distal pulses.  Prenatal labs: ABO, Rh: --/--/PENDING (07/11 1210) Antibody: PENDING (07/11 1210) Rubella: 1.53 (05/17 1553) RPR: Non Reactive (05/17 1553)  HBsAg: Negative (05/17 1553)  HIV: Non Reactive (05/17 1553)  GBS:     Assessment/Plan: 31 yo G4P0030 at [redacted]w[redacted]d with short cervix here for rescue cerclage - Risks, benefits and alternatives were explained including but not limited to risks of bleeding, infection. damage to adjacent organs and rupture of membrane with associated high risk of pre-viable premature delivery. Patient verbalized understanding and all questions were answered. - Consent signed      Ryon Layton 11/24/2021, 12:41 PM

## 2021-11-24 ENCOUNTER — Inpatient Hospital Stay (HOSPITAL_COMMUNITY): Payer: BC Managed Care – PPO | Admitting: Anesthesiology

## 2021-11-24 ENCOUNTER — Ambulatory Visit (HOSPITAL_COMMUNITY)
Admission: RE | Admit: 2021-11-24 | Discharge: 2021-11-24 | Disposition: A | Discharge status: Home / Self Care | Payer: BC Managed Care – PPO | Attending: Obstetrics and Gynecology | Admitting: Obstetrics and Gynecology

## 2021-11-24 ENCOUNTER — Encounter (HOSPITAL_COMMUNITY): Payer: Self-pay | Admitting: Obstetrics and Gynecology

## 2021-11-24 ENCOUNTER — Encounter (HOSPITAL_COMMUNITY)
Admission: RE | Disposition: A | Discharge status: Home / Self Care | Payer: Self-pay | Source: Home / Self Care | Attending: Obstetrics and Gynecology

## 2021-11-24 DIAGNOSIS — Z3A19 19 weeks gestation of pregnancy: Secondary | ICD-10-CM | POA: Diagnosis not present

## 2021-11-24 DIAGNOSIS — O162 Unspecified maternal hypertension, second trimester: Secondary | ICD-10-CM | POA: Diagnosis not present

## 2021-11-24 DIAGNOSIS — O10912 Unspecified pre-existing hypertension complicating pregnancy, second trimester: Secondary | ICD-10-CM | POA: Insufficient documentation

## 2021-11-24 DIAGNOSIS — Z01812 Encounter for preprocedural laboratory examination: Secondary | ICD-10-CM

## 2021-11-24 DIAGNOSIS — O99212 Obesity complicating pregnancy, second trimester: Secondary | ICD-10-CM | POA: Diagnosis not present

## 2021-11-24 DIAGNOSIS — O3432 Maternal care for cervical incompetence, second trimester: Secondary | ICD-10-CM | POA: Diagnosis not present

## 2021-11-24 DIAGNOSIS — O26872 Cervical shortening, second trimester: Secondary | ICD-10-CM | POA: Diagnosis not present

## 2021-11-24 HISTORY — PX: CERVICAL CERCLAGE: SHX1329

## 2021-11-24 LAB — TYPE AND SCREEN
ABO/RH(D): B POS
Antibody Screen: NEGATIVE

## 2021-11-24 LAB — RPR: RPR Ser Ql: NONREACTIVE

## 2021-11-24 LAB — CBC
HCT: 33.7 % — ABNORMAL LOW (ref 36.0–46.0)
Hemoglobin: 10.9 g/dL — ABNORMAL LOW (ref 12.0–15.0)
MCH: 26.6 pg (ref 26.0–34.0)
MCHC: 32.3 g/dL (ref 30.0–36.0)
MCV: 82.2 fL (ref 80.0–100.0)
Platelets: 288 10*3/uL (ref 150–400)
RBC: 4.1 MIL/uL (ref 3.87–5.11)
RDW: 14.6 % (ref 11.5–15.5)
WBC: 10.9 10*3/uL — ABNORMAL HIGH (ref 4.0–10.5)
nRBC: 0 % (ref 0.0–0.2)

## 2021-11-24 SURGERY — CERCLAGE, CERVIX, VAGINAL APPROACH
Anesthesia: Spinal

## 2021-11-24 MED ORDER — SOD CITRATE-CITRIC ACID 500-334 MG/5ML PO SOLN
30.0000 mL | Freq: Once | ORAL | Status: AC
  Administered 2021-11-24: 30 mL via ORAL

## 2021-11-24 MED ORDER — ONDANSETRON HCL 4 MG/2ML IJ SOLN: 4.0000 mg | Freq: Once | INTRAMUSCULAR | Status: DC | PRN

## 2021-11-24 MED ORDER — CHLORHEXIDINE GLUCONATE 0.12 % MT SOLN
15.0000 mL | Freq: Once | OROMUCOSAL | Status: AC
  Administered 2021-11-24: 15 mL via OROMUCOSAL

## 2021-11-24 MED ORDER — OXYCODONE-ACETAMINOPHEN 5-325 MG PO TABS: 1.0000 | ORAL_TABLET | Freq: Four times a day (QID) | ORAL | 0 refills | Status: DC | PRN

## 2021-11-24 MED ORDER — NALOXONE HCL 4 MG/10ML IJ SOLN: 1.0000 ug/kg/h | INTRAVENOUS | Status: DC | PRN

## 2021-11-24 MED ORDER — ACETAMINOPHEN 500 MG PO TABS
ORAL_TABLET | ORAL | Status: AC
  Filled 2021-11-24: qty 2

## 2021-11-24 MED ORDER — NALOXONE HCL 0.4 MG/ML IJ SOLN: 0.4000 mg | INTRAMUSCULAR | Status: DC | PRN

## 2021-11-24 MED ORDER — ACETAMINOPHEN 160 MG/5ML PO SOLN: 960.0000 mg | Freq: Once | ORAL | Status: AC

## 2021-11-24 MED ORDER — KETOROLAC TROMETHAMINE 30 MG/ML IJ SOLN: 30.0000 mg | Freq: Four times a day (QID) | INTRAMUSCULAR | Status: DC | PRN

## 2021-11-24 MED ORDER — CHLOROPROCAINE HCL 50 MG/5ML IT SOLN
INTRATHECAL | Status: DC | PRN
  Administered 2021-11-24: 40 mg via INTRATHECAL

## 2021-11-24 MED ORDER — CHLOROPROCAINE HCL 50 MG/5ML IT SOLN
INTRATHECAL | Status: AC
  Filled 2021-11-24: qty 5

## 2021-11-24 MED ORDER — ONDANSETRON HCL 4 MG/2ML IJ SOLN: 4.0000 mg | Freq: Three times a day (TID) | INTRAMUSCULAR | Status: DC | PRN

## 2021-11-24 MED ORDER — OXYCODONE HCL 5 MG/5ML PO SOLN: 5.0000 mg | Freq: Once | ORAL | Status: DC | PRN

## 2021-11-24 MED ORDER — SCOPOLAMINE 1 MG/3DAYS TD PT72: 1.0000 | MEDICATED_PATCH | Freq: Once | TRANSDERMAL | Status: DC

## 2021-11-24 MED ORDER — SODIUM CHLORIDE 0.9% FLUSH: 3.0000 mL | INTRAVENOUS | Status: DC | PRN

## 2021-11-24 MED ORDER — LACTATED RINGERS IV SOLN: INTRAVENOUS | Status: DC

## 2021-11-24 MED ORDER — ORAL CARE MOUTH RINSE: 15.0000 mL | Freq: Once | OROMUCOSAL | Status: AC

## 2021-11-24 MED ORDER — POVIDONE-IODINE 10 % EX SWAB: 2.0000 | Freq: Once | CUTANEOUS | Status: DC

## 2021-11-24 MED ORDER — MEPERIDINE HCL 25 MG/ML IJ SOLN: 6.2500 mg | INTRAMUSCULAR | Status: DC | PRN

## 2021-11-24 MED ORDER — COMFORT FIT MATERNITY SUPP LG MISC: 0 refills | Status: DC

## 2021-11-24 MED ORDER — FENTANYL CITRATE (PF) 100 MCG/2ML IJ SOLN
INTRAMUSCULAR | Status: DC | PRN
  Administered 2021-11-24: 25 ug via INTRATHECAL

## 2021-11-24 MED ORDER — ACETAMINOPHEN 500 MG PO TABS
1000.0000 mg | ORAL_TABLET | Freq: Once | ORAL | Status: AC
  Administered 2021-11-24: 1000 mg via ORAL

## 2021-11-24 MED ORDER — COMFORT FIT MATERNITY SUPP MED MISC: 0 refills | Status: DC

## 2021-11-24 MED ORDER — DIPHENHYDRAMINE HCL 25 MG PO CAPS: 25.0000 mg | ORAL_CAPSULE | ORAL | Status: DC | PRN

## 2021-11-24 MED ORDER — FENTANYL CITRATE (PF) 100 MCG/2ML IJ SOLN
INTRAMUSCULAR | Status: AC
  Filled 2021-11-24: qty 2

## 2021-11-24 MED ORDER — SOD CITRATE-CITRIC ACID 500-334 MG/5ML PO SOLN
ORAL | Status: AC
  Filled 2021-11-24: qty 30

## 2021-11-24 MED ORDER — FENTANYL CITRATE (PF) 100 MCG/2ML IJ SOLN: 25.0000 ug | INTRAMUSCULAR | Status: DC | PRN

## 2021-11-24 MED ORDER — OXYCODONE HCL 5 MG PO TABS: 5.0000 mg | ORAL_TABLET | Freq: Once | ORAL | Status: DC | PRN

## 2021-11-24 MED ORDER — FAMOTIDINE 20 MG PO TABS
ORAL_TABLET | ORAL | Status: AC
  Filled 2021-11-24: qty 1

## 2021-11-24 MED ORDER — DIPHENHYDRAMINE HCL 50 MG/ML IJ SOLN: 12.5000 mg | INTRAMUSCULAR | Status: DC | PRN

## 2021-11-24 MED ORDER — CHLORHEXIDINE GLUCONATE 0.12 % MT SOLN
OROMUCOSAL | Status: AC
  Filled 2021-11-24: qty 15

## 2021-11-24 MED ORDER — LIDOCAINE HCL (PF) 1 % IJ SOLN
INTRAMUSCULAR | Status: AC
  Filled 2021-11-24: qty 30

## 2021-11-24 MED ORDER — FAMOTIDINE 20 MG PO TABS
20.0000 mg | ORAL_TABLET | Freq: Once | ORAL | Status: AC
  Administered 2021-11-24: 20 mg via ORAL

## 2021-11-24 SURGICAL SUPPLY — 17 items
CANISTER SUCT 3000ML PPV (MISCELLANEOUS) ×6 IMPLANT
GLOVE BIOGEL PI IND STRL 6.5 (GLOVE) ×2 IMPLANT
GLOVE BIOGEL PI IND STRL 7.0 (GLOVE) ×2 IMPLANT
GLOVE BIOGEL PI INDICATOR 6.5 (GLOVE) ×1
GLOVE BIOGEL PI INDICATOR 7.0 (GLOVE) ×1
GLOVE SURG SS PI 6.5 STRL IVOR (GLOVE) ×3 IMPLANT
GOWN STRL REUS W/TWL LRG LVL3 (GOWN DISPOSABLE) ×6 IMPLANT
MAT PREVALON FULL STRYKER (MISCELLANEOUS) ×1 IMPLANT
NS IRRIG 1000ML POUR BTL (IV SOLUTION) ×3 IMPLANT
PACK VAGINAL MINOR WOMEN LF (CUSTOM PROCEDURE TRAY) ×3 IMPLANT
PAD OB MATERNITY 4.3X12.25 (PERSONAL CARE ITEMS) ×3 IMPLANT
PAD PREP 24X48 CUFFED NSTRL (MISCELLANEOUS) ×3 IMPLANT
SUT PROLENE 0 CT 1 30 (SUTURE) ×3 IMPLANT
TOWEL OR 17X24 6PK STRL BLUE (TOWEL DISPOSABLE) ×6 IMPLANT
TRAY FOLEY W/BAG SLVR 14FR (SET/KITS/TRAYS/PACK) ×3 IMPLANT
TUBING NON-CON 1/4 X 20 CONN (TUBING) ×3 IMPLANT
YANKAUER SUCT BULB TIP NO VENT (SUCTIONS) ×3 IMPLANT

## 2021-11-24 NOTE — Transfer of Care (Signed)
Immediate Anesthesia Transfer of Care Note  Patient: Susan Sellers  Procedure(s) Performed: CERCLAGE CERVICAL  Patient Location: PACU  Anesthesia Type:Spinal  Level of Consciousness: awake  Airway & Oxygen Therapy: Patient Spontanous Breathing  Post-op Assessment: Report given to RN  Post vital signs: Reviewed and stable  Last Vitals:  Vitals Value Taken Time  BP 130/72 11/24/21 1349  Temp    Pulse 92 11/24/21 1352  Resp 21 11/24/21 1352  SpO2 98 % 11/24/21 1352  Vitals shown include unvalidated device data.  Last Pain:  Vitals:   11/24/21 1229  TempSrc: Oral         Complications: No notable events documented.

## 2021-11-24 NOTE — Progress Notes (Signed)
Consent obtained for cerclage.  Anesthesia at bedside for consult.

## 2021-11-24 NOTE — Anesthesia Procedure Notes (Signed)
Spinal  Patient location during procedure: OR Start time: 11/24/2021 12:58 PM End time: 11/24/2021 1:02 PM Reason for block: surgical anesthesia Staffing Performed: anesthesiologist  Anesthesiologist: Beryle Lathe, MD Performed by: Beryle Lathe, MD Authorized by: Beryle Lathe, MD   Preanesthetic Checklist Completed: patient identified, IV checked, risks and benefits discussed, surgical consent, monitors and equipment checked, pre-op evaluation and timeout performed Spinal Block Patient position: sitting Prep: DuraPrep Patient monitoring: heart rate, cardiac monitor, continuous pulse ox and blood pressure Approach: midline Location: L3-4 Injection technique: single-shot Needle Needle type: Pencan  Needle gauge: 24 G Additional Notes Consent was obtained prior to the procedure with all questions answered and concerns addressed. Risks including, but not limited to, bleeding, infection, nerve damage, paralysis, failed block, inadequate analgesia, allergic reaction, high spinal, itching, and headache were discussed and the patient wished to proceed. Functioning IV was confirmed and monitors were applied. Sterile prep and drape, including hand hygiene, mask, and sterile gloves were used. The patient was positioned and the spine was prepped. The skin was anesthetized with lidocaine. Free flow of clear CSF was obtained prior to injecting local anesthetic into the CSF. The spinal needle aspirated freely following injection. The needle was carefully withdrawn. The patient tolerated the procedure well.   Leslye Peer, MD

## 2021-11-24 NOTE — Op Note (Signed)
Susan Sellers   PROCEDURE DATE: 11/24/2021  PREOPERATIVE DIAGNOSIS: Intrauterine pregnancy at [redacted]w[redacted]d, history of cervical incompetence and short cervix on recent ultrasound   POSTOPERATIVE DIAGNOSIS: The same PROCEDURE: Transvaginal McDonald Cervical Cerclage Placement SURGEON:  Dr. Catalina Antigua  INDICATIONS: 31 y.o. G4P0030 at [redacted]w[redacted]d with history of PPROM, cervical incompetence, and short cervix on anatomy ultrasound here for cerclage placement.   The risks of surgery were discussed in detail with the patient including but not limited to: bleeding; infection which may require antibiotic therapy; injury to cervix, vagina other surrounding organs; risk of ruptured membranes and/or preterm delivery and other postoperative or anesthesia complications.  Written informed consent was obtained.    FINDINGS:  No cervical length in the vagina with membranes flush with external os and cervix dilated 1.5-2 cm visually. Patient was made aware of these findings and understand the high risk of preterm premature rupture of membranes. Post procedure with closed cervix, cervical length of 1 cm in the vagina and suture knot placed anteriorly. Patient with a 2 minute episode of emesis intraoperatively and SSE reveals a dry vaginal vault and intact suture. No evidence of rupture of membranes. Bedside ultrasound reveals a viable intrauterine pregnancy with visually normal fluid levels  ANESTHESIA:  Spinal INTRAVENOUS FLUIDS: 800  ml ESTIMATED BLOOD LOSS: 10 ml URINE OUTPUT: 75 ml COMPLICATIONS: None immediate  PROCEDURE IN DETAIL:  The patient had sequential compression devices applied to her lower extremities while in the preoperative area.  Reassuring fetal heart rate was also obtained using a doppler. She was then taken to the operating room where spinal anesthesia was administered and was found to be adequate.  She was placed in the dorsal lithotomy, and was prepped and draped in a sterile manner. A foley catheter  was placed in her bladder.  After an adequate timeout was performed, a vaginal speculum was then placed in the patient's vagina and the anterior and posterior lips of the cervix was grasped with ring forceps. A curved needle loaded with a number 1 Prolene suture was inserted at 12 o'clock, as high as possible at the junction of the rugated vaginal epithelium and the smooth cervix, at least 1 cm above the external os.  Four bites are taken circumferentially around the entire cervix in a purse-string fashion, each bite should be deep enough to extend at least midway into the cervical stroma, but not into the endocervical canal. With the assistance of Trendelenburg, the membranes receded into the cervical canal. The two ends of the suture were then tied securely anteriorly and cut, leaving the ends long enough to grasp with a clamp when it is time to remove it. There was minimal bleeding noted and the ring forceps were removed with good hemostasis noted.  All instruments were removed from the patient's vagina.  Instrument, needle and sponge counts were correct x 2. The patient tolerated the procedure well, and was taken to the recovery area awake and in stable condition. Reassuring fetal heart rate was also obtained using a doppler in the recovery area.  The patient will be discharged to home as per PACU criteria.  Routine postoperative instructions given.  She was prescribed Percocet and Colace.  She will follow up in the clinic tomorrow for postoperative evaluation and ongoing prenatal care

## 2021-11-24 NOTE — Anesthesia Postprocedure Evaluation (Signed)
Anesthesia Post Note  Patient: Ticara Blea  Procedure(s) Performed: CERCLAGE CERVICAL     Patient location during evaluation: Mother Baby Anesthesia Type: Spinal Level of consciousness: oriented and awake and alert Pain management: pain level controlled Vital Signs Assessment: post-procedure vital signs reviewed and stable Respiratory status: spontaneous breathing and respiratory function stable Cardiovascular status: blood pressure returned to baseline and stable Postop Assessment: no headache, no backache, no apparent nausea or vomiting and able to ambulate Anesthetic complications: no   No notable events documented.  Last Vitals:  Vitals:   11/24/21 1445 11/24/21 1500  BP: 125/67 119/68  Pulse: (!) 55 (!) 59  Resp: 12 19  Temp:  36.8 C  SpO2: 99% 100%    Last Pain:  Vitals:   11/24/21 1229  TempSrc: Oral   Pain Goal:                   Beryle Lathe

## 2021-11-24 NOTE — Anesthesia Preprocedure Evaluation (Addendum)
Anesthesia Evaluation  Patient identified by MRN, date of birth, ID band Patient awake    Reviewed: Allergy & Precautions, NPO status , Patient's Chart, lab work & pertinent test results  History of Anesthesia Complications Negative for: history of anesthetic complications  Airway Mallampati: II  TM Distance: >3 FB Neck ROM: Full    Dental  (+) Dental Advisory Given, Missing   Pulmonary neg pulmonary ROS,    Pulmonary exam normal        Cardiovascular hypertension, Normal cardiovascular exam     Neuro/Psych negative neurological ROS  negative psych ROS   GI/Hepatic negative GI ROS, Neg liver ROS,   Endo/Other  Morbid obesity  Renal/GU negative Renal ROS     Musculoskeletal negative musculoskeletal ROS (+)   Abdominal (+) + obese,   Peds  Hematology  Alpha thalassemia silent carrier    Anesthesia Other Findings   Reproductive/Obstetrics (+) Pregnancy                            Anesthesia Physical Anesthesia Plan  ASA: 3  Anesthesia Plan: Spinal   Post-op Pain Management: Tylenol PO (pre-op)*   Induction:   PONV Risk Score and Plan: 2 and Treatment may vary due to age or medical condition  Airway Management Planned: Natural Airway  Additional Equipment: None  Intra-op Plan:   Post-operative Plan:   Informed Consent: I have reviewed the patients History and Physical, chart, labs and discussed the procedure including the risks, benefits and alternatives for the proposed anesthesia with the patient or authorized representative who has indicated his/her understanding and acceptance.       Plan Discussed with: CRNA, Anesthesiologist and Surgeon  Anesthesia Plan Comments: (Labs reviewed, platelets acceptable. Discussed risks and benefits of spinal, including spinal/epidural hematoma, infection, failed block, and PDPH. Patient expressed understanding and wished to proceed. )       Anesthesia Quick Evaluation

## 2021-11-25 ENCOUNTER — Encounter: Payer: Self-pay | Admitting: Obstetrics and Gynecology

## 2021-11-25 ENCOUNTER — Ambulatory Visit (INDEPENDENT_AMBULATORY_CARE_PROVIDER_SITE_OTHER): Payer: BC Managed Care – PPO | Admitting: Obstetrics and Gynecology

## 2021-11-25 VITALS — BP 118/79 | HR 80 | Wt 270.0 lb

## 2021-11-25 DIAGNOSIS — Z6841 Body Mass Index (BMI) 40.0 and over, adult: Secondary | ICD-10-CM

## 2021-11-25 DIAGNOSIS — O10919 Unspecified pre-existing hypertension complicating pregnancy, unspecified trimester: Secondary | ICD-10-CM

## 2021-11-25 DIAGNOSIS — M549 Dorsalgia, unspecified: Secondary | ICD-10-CM

## 2021-11-25 DIAGNOSIS — O09292 Supervision of pregnancy with other poor reproductive or obstetric history, second trimester: Secondary | ICD-10-CM

## 2021-11-25 DIAGNOSIS — D563 Thalassemia minor: Secondary | ICD-10-CM

## 2021-11-25 DIAGNOSIS — O3432 Maternal care for cervical incompetence, second trimester: Secondary | ICD-10-CM

## 2021-11-25 DIAGNOSIS — O3430 Maternal care for cervical incompetence, unspecified trimester: Secondary | ICD-10-CM | POA: Insufficient documentation

## 2021-11-25 DIAGNOSIS — O99891 Other specified diseases and conditions complicating pregnancy: Secondary | ICD-10-CM

## 2021-11-25 DIAGNOSIS — O0992 Supervision of high risk pregnancy, unspecified, second trimester: Secondary | ICD-10-CM

## 2021-11-25 DIAGNOSIS — Z3A19 19 weeks gestation of pregnancy: Secondary | ICD-10-CM

## 2021-11-25 DIAGNOSIS — O99210 Obesity complicating pregnancy, unspecified trimester: Secondary | ICD-10-CM

## 2021-11-25 MED ORDER — PROGESTERONE 200 MG PO CAPS: ORAL_CAPSULE | ORAL | 3 refills | Status: DC

## 2021-11-25 MED ORDER — CYCLOBENZAPRINE HCL 10 MG PO TABS: 10.0000 mg | ORAL_TABLET | Freq: Three times a day (TID) | ORAL | 2 refills | Status: DC | PRN

## 2021-11-25 NOTE — Progress Notes (Signed)
ROB 19.[redacted] wks GA Cerclage 11/24/21, scant brown discharge

## 2021-11-25 NOTE — Progress Notes (Signed)
   PRENATAL VISIT NOTE  Subjective:  Susan Sellers is a 31 y.o. G4P0030 at [redacted]w[redacted]d being seen today for ongoing prenatal care.  She is currently monitored for the following issues for this high-risk pregnancy and has BMI 50.0-59.9, adult--morbid obesity; History of preterm premature rupture of membranes (PPROM) in previous pregnancy at 18 weeks, currently pregnant; Hypertension; Supervision of high-risk pregnancy; Maternal morbid obesity, antepartum (HCC); Chronic hypertension affecting pregnancy; Abnormal cervical Papanicolaou smear on 02/02/2021, normal colposcopy; Alpha thalassemia silent carrier; Incompetent cervix in pregnancy, antepartum, second trimester; and Cervical cerclage suture present in second trimester on their problem list.  Patient doing well with no acute concerns today. She reports no complaints.  Contractions: Not present. Vag. Bleeding: Scant.  Movement: Present. Denies leaking of fluid.   Pt had rescue cerclage placed on 11/24/21.  Spoke with Dr. Jolayne Panther.  There were no complications.  Pt is doing well today.  Reviewed MFM note prescribed prometrium due to cervical shortening.  The following portions of the patient's history were reviewed and updated as appropriate: allergies, current medications, past family history, past medical history, past social history, past surgical history and problem list. Problem list updated.  Objective:   Vitals:   11/25/21 1538  BP: 118/79  Pulse: 80  Weight: 270 lb (122.5 kg)    Fetal Status: Fetal Heart Rate (bpm): 145   Movement: Present     General:  Alert, oriented and cooperative. Patient is in no acute distress.  Skin: Skin is warm and dry. No rash noted.   Cardiovascular: Normal heart rate noted  Respiratory: Normal respiratory effort, no problems with respiration noted  Abdomen: Soft, gravid, appropriate for gestational age.  Pain/Pressure: Absent     Pelvic: Cervical exam deferred        Extremities: Normal range of motion.   Edema: None  Mental Status:  Normal mood and affect. Normal behavior. Normal judgment and thought content.   Assessment and Plan:  Pregnancy: G4P0030 at [redacted]w[redacted]d  1. Chronic hypertension affecting pregnancy BP  WNL  2. Incompetent cervix in pregnancy, antepartum, second trimester Rescure cerclage placed, she has u/s f/u on 12/11/21 - progesterone (PROMETRIUM) 200 MG capsule; Place one capsule vaginally at bedtime  Dispense: 30 capsule; Refill: 3  3. History of preterm premature rupture of membranes (PROM) in previous pregnancy, currently pregnant in second trimester   4. Alpha thalassemia silent carrier   5. BMI 50.0-59.9, adult--morbid obesity   6. [redacted] weeks gestation of pregnancy   7. Supervision of high risk pregnancy in second trimester Continue routine prenatal care  8. Maternal morbid obesity, antepartum (HCC)   9. Cervical cerclage suture present in second trimester  - progesterone (PROMETRIUM) 200 MG capsule; Place one capsule vaginally at bedtime  Dispense: 30 capsule; Refill: 3  10. Back pain affecting pregnancy in second trimester If medication fails can consider short term of percocet  - cyclobenzaprine (FLEXERIL) 10 MG tablet; Take 1 tablet (10 mg total) by mouth 3 (three) times daily as needed for muscle spasms.  Dispense: 30 tablet; Refill: 2  Preterm labor symptoms and general obstetric precautions including but not limited to vaginal bleeding, contractions, leaking of fluid and fetal movement were reviewed in detail with the patient.  Please refer to After Visit Summary for other counseling recommendations.   Return in about 4 weeks (around 12/23/2021) for [facility], in person.   Mariel Aloe, MD Faculty Attending Center for [facility]

## 2021-12-01 ENCOUNTER — Encounter: Payer: Self-pay | Admitting: Obstetrics and Gynecology

## 2021-12-11 ENCOUNTER — Encounter (HOSPITAL_COMMUNITY): Payer: Self-pay | Admitting: *Deleted

## 2021-12-11 ENCOUNTER — Ambulatory Visit: Payer: BC Managed Care – PPO | Attending: Obstetrics and Gynecology | Admitting: Obstetrics and Gynecology

## 2021-12-11 ENCOUNTER — Telehealth (HOSPITAL_COMMUNITY): Payer: Self-pay | Admitting: *Deleted

## 2021-12-11 ENCOUNTER — Encounter: Payer: Self-pay | Admitting: *Deleted

## 2021-12-11 ENCOUNTER — Other Ambulatory Visit: Payer: Self-pay | Admitting: Obstetrics and Gynecology

## 2021-12-11 ENCOUNTER — Other Ambulatory Visit: Payer: Self-pay | Admitting: Maternal & Fetal Medicine

## 2021-12-11 ENCOUNTER — Ambulatory Visit: Payer: BC Managed Care – PPO | Admitting: *Deleted

## 2021-12-11 ENCOUNTER — Ambulatory Visit: Payer: BC Managed Care – PPO | Attending: Maternal & Fetal Medicine

## 2021-12-11 VITALS — BP 125/80 | HR 84

## 2021-12-11 DIAGNOSIS — O3432 Maternal care for cervical incompetence, second trimester: Secondary | ICD-10-CM | POA: Diagnosis not present

## 2021-12-11 DIAGNOSIS — O0992 Supervision of high risk pregnancy, unspecified, second trimester: Secondary | ICD-10-CM | POA: Diagnosis not present

## 2021-12-11 DIAGNOSIS — E669 Obesity, unspecified: Secondary | ICD-10-CM

## 2021-12-11 DIAGNOSIS — D563 Thalassemia minor: Secondary | ICD-10-CM | POA: Diagnosis not present

## 2021-12-11 DIAGNOSIS — O09292 Supervision of pregnancy with other poor reproductive or obstetric history, second trimester: Secondary | ICD-10-CM | POA: Diagnosis not present

## 2021-12-11 DIAGNOSIS — O285 Abnormal chromosomal and genetic finding on antenatal screening of mother: Secondary | ICD-10-CM | POA: Diagnosis not present

## 2021-12-11 DIAGNOSIS — O10919 Unspecified pre-existing hypertension complicating pregnancy, unspecified trimester: Secondary | ICD-10-CM | POA: Insufficient documentation

## 2021-12-11 DIAGNOSIS — O2622 Pregnancy care for patient with recurrent pregnancy loss, second trimester: Secondary | ICD-10-CM

## 2021-12-11 DIAGNOSIS — O10012 Pre-existing essential hypertension complicating pregnancy, second trimester: Secondary | ICD-10-CM | POA: Diagnosis not present

## 2021-12-11 DIAGNOSIS — O99212 Obesity complicating pregnancy, second trimester: Secondary | ICD-10-CM | POA: Diagnosis not present

## 2021-12-11 DIAGNOSIS — Z3A22 22 weeks gestation of pregnancy: Secondary | ICD-10-CM

## 2021-12-11 DIAGNOSIS — Z01818 Encounter for other preprocedural examination: Secondary | ICD-10-CM

## 2021-12-11 NOTE — Progress Notes (Signed)
OB Telephone Note Case posted at 8am with Dr. Judeth Cornfield. I d/w Ms. Pittmon to arrive at [facility] for labwork and to be NPO after midnight. All questions asked and answered.  Cornelia Copa MD Attending Center for [employer] (Faculty Practice) 12/11/2021 Time: 1002am

## 2021-12-11 NOTE — Progress Notes (Addendum)
Maternal-Fetal Medicine   Name: Susan Sellers DOB: 1991-02-16 MRN: [id number] Referring Provider: Mariel Aloe, MD  I had the pleasure of seeing Susan Sellers today at the [facility].  She was accompanied by her husband. On 11/24/2021 she had rescue cerclage.  Patient takes vaginal progesterone daily.  She does not have symptoms of pelvic pressure or vaginal bleeding.  Patient has chronic hypertension that is well controlled without antihypertensives.  Blood pressure today at her office is 125/80 mmHg.  Ultrasound A limited ultrasound study was performed.  Amniotic fluid is normal and good fetal activity seen.  On transabdominal scan, the cervical canal was dilated and the membranes seem to be at the external os.  On transvaginal scan, the cervical canal was dilated to the external os and no measurable portion of the cervix was seen.  The cerclage could not be visualized.  After explaining, I performed a sterile speculum examination.  Vagina and cervix appear healthy.  The external os is visually dilated to 2 to 3 cm and the membranes are visible at 10 not beyond the external os.  Proline stitches seen close to the external os.  Cervical insufficiency I explained the findings with help of ultrasound images and diagrams.  The amniotic membranes have gone beyond the level of the cerclage and the membranes are visible at the external os.  I counseled the patient that there is an increased risk of miscarriage or preterm delivery. I discussed the options including continuation of expectant management with vaginal progesterone or revision cerclage.  Very preterm infants are more-likely to have neurological complications including cerebral palsy.  Only very few case reports are available regarding the outcomes of revision cerclage.  In a small number of cases, mean latency was 58 days and live birth rate was 94%. (Pregnancy outcomes after cerclage revision, AJOG 2017, vol 216,  issue 1, pp. G867-Y195).  I counseled the couple that revision cerclage does not guarantee good outcomes. As the membranes are exposed, complication of rupture of membranes is a possibility while attempting cerclage procedure.  I explained the procedure and possible complications including miscarriage, rupture of membranes, infection, hemorrhage or injuries to bladder or bowel (rare).  In summary, opinion on revision cerclage is divided and she has an option of continuing her pregnancy without revision cerclage. Revision cerclage carries operative risks mentioned above.  The couple left our office to discuss, and the patient later decided to have revision cerclage procedure. She prefers to have it performed tomorrow. Cerclage procedure will be performed tomorrow. I told the patient to keep NPO after 11 PM tonight.  Recommendations -Revision cerclage tomorrow. -Follow-up cervical length measurement and completion of fetal anatomy on 12/30/21.  Thank you for consultation.  If you have any questions or concerns, please contact me the [facility].  Consultation including face-to-face (more than 50%) counseling 30 minutes.

## 2021-12-11 NOTE — Telephone Encounter (Signed)
Preadmission screen  

## 2021-12-12 ENCOUNTER — Ambulatory Visit (HOSPITAL_COMMUNITY): Payer: BC Managed Care – PPO | Admitting: Anesthesiology

## 2021-12-12 ENCOUNTER — Ambulatory Visit (HOSPITAL_COMMUNITY)
Admission: RE | Admit: 2021-12-12 | Discharge: 2021-12-12 | Disposition: A | Discharge status: Home / Self Care | Payer: BC Managed Care – PPO | Attending: Obstetrics and Gynecology | Admitting: Obstetrics and Gynecology

## 2021-12-12 ENCOUNTER — Other Ambulatory Visit: Payer: Self-pay

## 2021-12-12 ENCOUNTER — Encounter (HOSPITAL_COMMUNITY): Payer: Self-pay | Admitting: Obstetrics and Gynecology

## 2021-12-12 ENCOUNTER — Encounter (HOSPITAL_COMMUNITY)
Admission: RE | Disposition: A | Discharge status: Home / Self Care | Payer: Self-pay | Source: Home / Self Care | Attending: Obstetrics and Gynecology

## 2021-12-12 DIAGNOSIS — D649 Anemia, unspecified: Secondary | ICD-10-CM | POA: Diagnosis not present

## 2021-12-12 DIAGNOSIS — O99012 Anemia complicating pregnancy, second trimester: Secondary | ICD-10-CM | POA: Diagnosis not present

## 2021-12-12 DIAGNOSIS — Z3A22 22 weeks gestation of pregnancy: Secondary | ICD-10-CM | POA: Insufficient documentation

## 2021-12-12 DIAGNOSIS — O3432 Maternal care for cervical incompetence, second trimester: Secondary | ICD-10-CM | POA: Diagnosis not present

## 2021-12-12 DIAGNOSIS — Z01818 Encounter for other preprocedural examination: Secondary | ICD-10-CM

## 2021-12-12 DIAGNOSIS — O162 Unspecified maternal hypertension, second trimester: Secondary | ICD-10-CM | POA: Diagnosis not present

## 2021-12-12 HISTORY — PX: CERVICAL CERCLAGE: SHX1329

## 2021-12-12 LAB — CBC WITH DIFFERENTIAL/PLATELET
Abs Immature Granulocytes: 0.06 10*3/uL (ref 0.00–0.07)
Basophils Absolute: 0.1 10*3/uL (ref 0.0–0.1)
Basophils Relative: 0 %
Eosinophils Absolute: 0.2 10*3/uL (ref 0.0–0.5)
Eosinophils Relative: 2 %
HCT: 31.7 % — ABNORMAL LOW (ref 36.0–46.0)
Hemoglobin: 10.3 g/dL — ABNORMAL LOW (ref 12.0–15.0)
Immature Granulocytes: 1 %
Lymphocytes Relative: 26 %
Lymphs Abs: 3 10*3/uL (ref 0.7–4.0)
MCH: 26.5 pg (ref 26.0–34.0)
MCHC: 32.5 g/dL (ref 30.0–36.0)
MCV: 81.7 fL (ref 80.0–100.0)
Monocytes Absolute: 1 10*3/uL (ref 0.1–1.0)
Monocytes Relative: 9 %
Neutro Abs: 7.2 10*3/uL (ref 1.7–7.7)
Neutrophils Relative %: 62 %
Platelets: 266 10*3/uL (ref 150–400)
RBC: 3.88 MIL/uL (ref 3.87–5.11)
RDW: 14.4 % (ref 11.5–15.5)
WBC: 11.5 10*3/uL — ABNORMAL HIGH (ref 4.0–10.5)
nRBC: 0 % (ref 0.0–0.2)

## 2021-12-12 LAB — TYPE AND SCREEN
ABO/RH(D): B POS
Antibody Screen: NEGATIVE

## 2021-12-12 SURGERY — CERCLAGE, CERVIX, VAGINAL APPROACH
Anesthesia: Spinal

## 2021-12-12 MED ORDER — CEFAZOLIN SODIUM-DEXTROSE 2-4 GM/100ML-% IV SOLN
INTRAVENOUS | Status: AC
  Filled 2021-12-12: qty 100

## 2021-12-12 MED ORDER — INDOMETHACIN 50 MG RE SUPP
50.0000 mg | Freq: Once | RECTAL | Status: AC | PRN
  Administered 2021-12-12: 50 mg via RECTAL
  Filled 2021-12-12: qty 1

## 2021-12-12 MED ORDER — FENTANYL CITRATE (PF) 100 MCG/2ML IJ SOLN: 25.0000 ug | INTRAMUSCULAR | Status: DC | PRN

## 2021-12-12 MED ORDER — FENTANYL CITRATE (PF) 100 MCG/2ML IJ SOLN
INTRAMUSCULAR | Status: AC
  Filled 2021-12-12: qty 2

## 2021-12-12 MED ORDER — POVIDONE-IODINE 10 % EX SWAB
2.0000 | Freq: Once | CUTANEOUS | Status: AC
  Administered 2021-12-12: 2 via TOPICAL

## 2021-12-12 MED ORDER — SOD CITRATE-CITRIC ACID 500-334 MG/5ML PO SOLN
30.0000 mL | ORAL | Status: AC
  Administered 2021-12-12: 30 mL via ORAL

## 2021-12-12 MED ORDER — FENTANYL CITRATE (PF) 100 MCG/2ML IJ SOLN
INTRAMUSCULAR | Status: DC | PRN
  Administered 2021-12-12: 15 ug via INTRATHECAL

## 2021-12-12 MED ORDER — CEFAZOLIN SODIUM-DEXTROSE 2-4 GM/100ML-% IV SOLN
2.0000 g | INTRAVENOUS | Status: AC
  Administered 2021-12-12: 2 g via INTRAVENOUS

## 2021-12-12 MED ORDER — LACTATED RINGERS IV SOLN: INTRAVENOUS | Status: DC

## 2021-12-12 MED ORDER — STERILE WATER FOR IRRIGATION IR SOLN
Status: DC | PRN
  Administered 2021-12-12: 1

## 2021-12-12 MED ORDER — SOD CITRATE-CITRIC ACID 500-334 MG/5ML PO SOLN
ORAL | Status: AC
  Filled 2021-12-12: qty 30

## 2021-12-12 MED ORDER — SODIUM CHLORIDE 0.9 % IR SOLN
Status: DC | PRN
  Administered 2021-12-12: 1

## 2021-12-12 MED ORDER — BUPIVACAINE HCL (PF) 0.75 % IJ SOLN
INTRAMUSCULAR | Status: DC | PRN
  Administered 2021-12-12: 1.4 mL

## 2021-12-12 SURGICAL SUPPLY — 20 items
CANISTER SUCT 3000ML PPV (MISCELLANEOUS) ×3 IMPLANT
CATH CERVICAL RIPENING BALLOON (CATHETERS) ×3 IMPLANT
ELECT REM PT RETURN 9FT ADLT (ELECTROSURGICAL) ×2
ELECTRODE REM PT RTRN 9FT ADLT (ELECTROSURGICAL) ×2 IMPLANT
GLOVE BIO SURGEON STRL SZ7.5 (GLOVE) ×3 IMPLANT
GLOVE BIOGEL PI IND STRL 8 (GLOVE) ×2 IMPLANT
GLOVE BIOGEL PI INDICATOR 8 (GLOVE) ×1
GOWN STRL REUS W/ TWL LRG LVL3 (GOWN DISPOSABLE) ×4 IMPLANT
GOWN STRL REUS W/TWL LRG LVL3 (GOWN DISPOSABLE) ×4
HIBICLENS CHG 4% 4OZ BTL (MISCELLANEOUS) ×3 IMPLANT
PACK VAGINAL MINOR WOMEN LF (CUSTOM PROCEDURE TRAY) ×3 IMPLANT
PAD OB MATERNITY 4.3X12.25 (PERSONAL CARE ITEMS) ×3 IMPLANT
PAD PREP 24X48 CUFFED NSTRL (MISCELLANEOUS) ×3 IMPLANT
PENCIL BUTTON HOLSTER BLD 10FT (ELECTRODE) ×3 IMPLANT
SUT MERSILENE FIBER S 5 MO-4 1 (SUTURE) ×3 IMPLANT
SYR BULB IRRIGATION 50ML (SYRINGE) IMPLANT
TOWEL OR 17X24 6PK STRL BLUE (TOWEL DISPOSABLE) ×6 IMPLANT
TRAY FOLEY W/BAG SLVR 14FR (SET/KITS/TRAYS/PACK) ×3 IMPLANT
TUBING NON-CON 1/4 X 20 CONN (TUBING) ×3 IMPLANT
YANKAUER SUCT BULB TIP NO VENT (SUCTIONS) ×3 IMPLANT

## 2021-12-12 NOTE — Brief Op Note (Signed)
12/12/2021  10:03 AM  PATIENT:  Susan Sellers  31 y.o. female  PRE-OPERATIVE DIAGNOSIS:  Cervical Insufficiency 22 weeks 1day  POST-OPERATIVE DIAGNOSIS:  Revision cervical Cerclage 22 weeks 1day  PROCEDURE:  Procedure(s): CERCLAGE CERVICAL (N/A)  SURGEON:  Surgeon(s) and Role:    * Noralee Space, MD - Primary    * Hermina Staggers, MD - Assisting  PHYSICIAN ASSISTANT:   ASSISTANTS: none   ANESTHESIA:   spinal  EBL:  25 mL  BLOOD ADMINISTERED:none  DRAINS: none   LOCAL MEDICATIONS USED:  NONE  SPECIMEN:  No Specimen  DISPOSITION OF SPECIMEN:  N/A  COUNTS:  YES  TOURNIQUET:  * No tourniquets in log *  DICTATION: .Note written in EPIC  PLAN OF CARE: Discharge to home after PACU  PATIENT DISPOSITION:  PACU - hemodynamically stable.   Delay start of Pharmacological VTE agent (>24hrs) due to surgical blood loss or risk of bleeding: not applicable

## 2021-12-12 NOTE — Op Note (Signed)
Maternal-Fetal Medicine   Name: Susan Sellers MRN: [id number] Procedure: Revision cerclage   PRE-OPERATIVE DIAGNOSIS:  22-weeks' gestation with cervical insufficiency with previous cerclage POST-OPERATIVE DIAGNOSIS:  Same with revision cerclage   PROCEDURE:  Procedure(s): RESCUE CERCLAGE CERVICAL   SURGEON:  Surgeon(s) and Role:  ** Noralee Space, MD - Primary   *Arlyce Harman - Assisting      ANESTHESIA:   spinal    I discussed procedure and complications in detail before obtaining consent in the PACU After informed consent, the patient was taken to the OR and after spinal anesthesia, the bladder was catheterized by the nurse and the Foley catheter was left in situ. The patient was prepped and draped in dorsal lithotomy position. A sterile weighted speculum was inserted. Vagina and cervix was cleaned with betadine under vision.  The external os was about 2 to 3 cm dilated and the amniotic membrane was visible at the external os. Previous Proline loose stitch was seen at the external os. The cervix was about 1 to 1.5 cm long.   The anterior lip of the cervix was held with a ring forceps.  With help of Bovie, a small incision of about 2 cm was made at the cervicovesical junction and the bladder was gently pushed up. Vesicocervical space (bloodless) was clearly seen. Previous Proline stitch was cut and removed. A Cook catheter with the bulb inflated by about 3 milliliters of saline was introduced into the cervical canal and the membrane was gently pushed into the cervical canal and held by the assistant.   A Mersilene 5 tape stitch was placed circumferentially starting at 11 O'Clock position and around the cervix. Final stitch exited close to 1 O'Clock position.  While the stitches were being placed, the intracervical catheter slid into the vagina. The membranes had retracted into the cervical canal and were not visible. I did not attempt to reinsert the catheter.Before placing the knot,  500 mL of saline was instilled Into the bladder by the nurse. This was to facilitate retraction of amniotic membranes into the uterine cavity. Five secure knots were placed anteriorly. The external os was closed. Vaginal examination revealed no inadvertent suture on the vagina. Counts correct. Estimated blood loss: 30 milliliters.    I reassured the patient of successful procedure. I recommended that she continue taking vaginal progesterone from tomorrow night. Patient left the operating room in stable condition. . -We made an appointment for her to come to the [facility] in 2 weeks to evaluate  the cervix.

## 2021-12-12 NOTE — Anesthesia Preprocedure Evaluation (Addendum)
Anesthesia Evaluation  Patient identified by MRN, date of birth, ID band Patient awake    Reviewed: Allergy & Precautions, NPO status , Patient's Chart, lab work & pertinent test results  History of Anesthesia Complications Negative for: history of anesthetic complications  Airway Mallampati: III  TM Distance: >3 FB Neck ROM: Full    Dental  (+) Teeth Intact, Dental Advisory Given   Pulmonary neg pulmonary ROS,    breath sounds clear to auscultation       Cardiovascular hypertension,  Rhythm:Regular     Neuro/Psych negative neurological ROS  negative psych ROS   GI/Hepatic negative GI ROS, Neg liver ROS,   Endo/Other  negative endocrine ROS  Renal/GU negative Renal ROS     Musculoskeletal negative musculoskeletal ROS (+)   Abdominal   Peds  Hematology  (+) Blood dyscrasia, anemia , Lab Results      Component                Value               Date                      WBC                      11.5 (H)            12/12/2021                HGB                      10.3 (L)            12/12/2021                HCT                      31.7 (L)            12/12/2021                MCV                      81.7                12/12/2021                PLT                      266                 12/12/2021              Anesthesia Other Findings   Reproductive/Obstetrics (+) Pregnancy                            Anesthesia Physical Anesthesia Plan  ASA: 2  Anesthesia Plan: Spinal   Post-op Pain Management: Ofirmev IV (intra-op)*   Induction:   PONV Risk Score and Plan: 2 and Ondansetron and Treatment may vary due to age or medical condition  Airway Management Planned: Nasal Cannula and Natural Airway  Additional Equipment: None  Intra-op Plan:   Post-operative Plan:   Informed Consent: I have reviewed the patients History and Physical, chart, labs and discussed the procedure  including the risks, benefits and alternatives for the proposed anesthesia with the patient or authorized representative who has  indicated his/her understanding and acceptance.     Dental advisory given  Plan Discussed with: CRNA  Anesthesia Plan Comments:        Anesthesia Quick Evaluation

## 2021-12-12 NOTE — Transfer of Care (Signed)
Immediate Anesthesia Transfer of Care Note  Patient: Susan Sellers  Procedure(s) Performed: CERCLAGE CERVICAL  Patient Location: PACU  Anesthesia Type:Spinal  Level of Consciousness: awake  Airway & Oxygen Therapy: Patient Spontanous Breathing  Post-op Assessment: Report given to RN and Post -op Vital signs reviewed and stable  Post vital signs: Reviewed and stable  Last Vitals:  Vitals Value Taken Time  BP    Temp    Pulse    Resp    SpO2      Last Pain:  Vitals:   12/12/21 0748  TempSrc:   PainSc: 0-No pain         Complications: No notable events documented.

## 2021-12-12 NOTE — Anesthesia Procedure Notes (Signed)
Spinal  Patient location during procedure: OR Start time: 12/12/2021 8:28 AM End time: 12/12/2021 8:33 AM Reason for block: surgical anesthesia Staffing Performed by: Val Eagle, MD Authorized by: Val Eagle, MD   Preanesthetic Checklist Completed: patient identified, IV checked, risks and benefits discussed, surgical consent, monitors and equipment checked, pre-op evaluation and timeout performed Spinal Block Patient position: sitting Prep: DuraPrep Patient monitoring: heart rate, cardiac monitor, continuous pulse ox and blood pressure Approach: midline Location: L4-5 Injection technique: single-shot Needle Needle type: Sprotte  Needle gauge: 24 G Needle length: 9 cm Assessment Sensory level: T6 Events: CSF return

## 2021-12-12 NOTE — Progress Notes (Signed)
Pt discharged per MD order and Protocol. Pt ambulated safely in room independently. RN reviewed discharge instructions with patient and her husband. All questions answered and pt aware of follow up appointments.

## 2021-12-12 NOTE — Anesthesia Postprocedure Evaluation (Addendum)
Anesthesia Post Note  Patient: Susan Sellers  Procedure(s) Performed: CERCLAGE CERVICAL     Patient location during evaluation: PACU Anesthesia Type: Spinal Level of consciousness: awake and alert Pain management: pain level controlled Vital Signs Assessment: post-procedure vital signs reviewed and stable Respiratory status: spontaneous breathing, nonlabored ventilation and respiratory function stable Cardiovascular status: stable and blood pressure returned to baseline Postop Assessment: no apparent nausea or vomiting and spinal receding Anesthetic complications: no   No notable events documented.  Last Vitals:  Vitals:   12/12/21 1115 12/12/21 1130  BP: 105/62 125/75  Pulse: (!) 57 61  Resp: 15 12  Temp:  36.7 C  SpO2: 100% 100%    Last Pain:  Vitals:   12/12/21 1130  TempSrc: Axillary  PainSc: 0-No pain   Pain Goal:                   [name]

## 2021-12-12 NOTE — Progress Notes (Signed)
Maternal-Fetal Medicine  Patient is admitted for revision cerclage procedure.  P/E: Patient is comfortable; not in pain. She does not have uterine contractions. BP 132/80 mm Hg, pulse 85/min Abd: Soft gravid uterus; no tenderness  I explained revision cerclage procedure. Bladder is likely to be reflected upward to facilitate a higher stitch. The membranes will be gently pushed with a Foley/Cook bulb/ I may fill the bladder with saline to retract the membranes into the uterine cavity before tying the knot. Postoperative ultrasound may be performed if necessary/  Possible complications including rupture of membranes, vaginal bleeding, infection, miscarriage, injuries to bladder or bowel. Alternatively, she can continue with expectant management.   After counseling, the patient opted to have revision cerclage. Her husband was present in the room during counseling.

## 2021-12-16 ENCOUNTER — Telehealth: Payer: Self-pay

## 2021-12-16 ENCOUNTER — Encounter: Payer: Self-pay | Admitting: Obstetrics and Gynecology

## 2021-12-16 NOTE — Telephone Encounter (Signed)
S/w pt and advised that Dr. Alysia Penna stated that she can be out of work until further notice. Pt has appt with Dr. Alysia Penna on 12/23/21 and next U/S on 8/16, advised that we can update STD paperwork with return to work date of the 16th and change if needed.

## 2021-12-16 NOTE — Telephone Encounter (Signed)
Returned call, no answer, left vm 

## 2021-12-21 ENCOUNTER — Ambulatory Visit: Payer: BC Managed Care – PPO

## 2021-12-23 ENCOUNTER — Ambulatory Visit (INDEPENDENT_AMBULATORY_CARE_PROVIDER_SITE_OTHER): Payer: BC Managed Care – PPO | Admitting: Obstetrics and Gynecology

## 2021-12-23 ENCOUNTER — Encounter: Payer: Self-pay | Admitting: Obstetrics and Gynecology

## 2021-12-23 VITALS — BP 106/71 | HR 82 | Wt 269.0 lb

## 2021-12-23 DIAGNOSIS — O3432 Maternal care for cervical incompetence, second trimester: Secondary | ICD-10-CM

## 2021-12-23 DIAGNOSIS — O09292 Supervision of pregnancy with other poor reproductive or obstetric history, second trimester: Secondary | ICD-10-CM

## 2021-12-23 DIAGNOSIS — O10919 Unspecified pre-existing hypertension complicating pregnancy, unspecified trimester: Secondary | ICD-10-CM

## 2021-12-23 DIAGNOSIS — O0992 Supervision of high risk pregnancy, unspecified, second trimester: Secondary | ICD-10-CM

## 2021-12-23 DIAGNOSIS — O10912 Unspecified pre-existing hypertension complicating pregnancy, second trimester: Secondary | ICD-10-CM

## 2021-12-23 NOTE — Progress Notes (Signed)
Subjective:  Susan Sellers is a 31 y.o. G4P0030 at 110w5d being seen today for ongoing prenatal care.  She is currently monitored for the following issues for this high-risk pregnancy and has BMI 50.0-59.9, adult--morbid obesity; History of preterm premature rupture of membranes (PPROM) in previous pregnancy at 18 weeks, currently pregnant; Hypertension; Supervision of high-risk pregnancy; Maternal morbid obesity, antepartum (HCC); Chronic hypertension affecting pregnancy; Abnormal cervical Papanicolaou smear on 02/02/2021, normal colposcopy; Alpha thalassemia silent carrier; Incompetent cervix in pregnancy, antepartum, second trimester; and Cervical cerclage suture present in second trimester on their problem list.  Patient reports no complaints.  Contractions: Not present. Vag. Bleeding: None.  Movement: Present. Denies leaking of fluid.   The following portions of the patient's history were reviewed and updated as appropriate: allergies, current medications, past family history, past medical history, past social history, past surgical history and problem list. Problem list updated.  Objective:   Vitals:   12/23/21 1045  BP: 106/71  Pulse: 82  Weight: 122 kg    Fetal Status: Fetal Heart Rate (bpm): 145   Movement: Present     General:  Alert, oriented and cooperative. Patient is in no acute distress.  Skin: Skin is warm and dry. No rash noted.   Cardiovascular: Normal heart rate noted  Respiratory: Normal respiratory effort, no problems with respiration noted  Abdomen: Soft, gravid, appropriate for gestational age. Pain/Pressure: Absent     Pelvic:  Cervical exam deferred        Extremities: Normal range of motion.  Edema: None  Mental Status: Normal mood and affect. Normal behavior. Normal judgment and thought content.   Urinalysis:      Assessment and Plan:  Pregnancy: G4P0030 at [redacted]w[redacted]d  1. Supervision of high risk pregnancy in second trimester Stable Glucola next visit  2.  Incompetent cervix in pregnancy, antepartum, second trimester Stable Revised cerclage placed 12/12/21 Denies cramps, ut ctx, bleeding or LOF U/S for CL on 12/30/21 Continue with Prometrium and pelvis rest Work note til 01/04/22  3. Cervical cerclage suture present in second trimester See above  4. History of preterm premature rupture of membranes (PROM) in previous pregnancy, currently pregnant in second trimester See above  5. Chronic hypertension affecting pregnancy Stable  Preterm labor symptoms and general obstetric precautions including but not limited to vaginal bleeding, contractions, leaking of fluid and fetal movement were reviewed in detail with the patient. Please refer to After Visit Summary for other counseling recommendations.  Return in about 4 weeks (around 01/20/2022) for OB visit, face to face, MD only, fasting appt for Glucola.   Hermina Staggers, MD

## 2021-12-23 NOTE — Patient Instructions (Signed)

## 2021-12-29 ENCOUNTER — Ambulatory Visit: Payer: BC Managed Care – PPO

## 2021-12-29 ENCOUNTER — Other Ambulatory Visit: Payer: Self-pay | Admitting: *Deleted

## 2021-12-29 DIAGNOSIS — O3432 Maternal care for cervical incompetence, second trimester: Secondary | ICD-10-CM

## 2021-12-30 ENCOUNTER — Other Ambulatory Visit: Payer: Self-pay | Admitting: *Deleted

## 2021-12-30 ENCOUNTER — Ambulatory Visit (HOSPITAL_BASED_OUTPATIENT_CLINIC_OR_DEPARTMENT_OTHER): Payer: BC Managed Care – PPO

## 2021-12-30 ENCOUNTER — Ambulatory Visit: Payer: BC Managed Care – PPO | Attending: Maternal & Fetal Medicine | Admitting: *Deleted

## 2021-12-30 VITALS — BP 126/78 | HR 103

## 2021-12-30 DIAGNOSIS — O3432 Maternal care for cervical incompetence, second trimester: Secondary | ICD-10-CM | POA: Insufficient documentation

## 2021-12-30 DIAGNOSIS — D563 Thalassemia minor: Secondary | ICD-10-CM

## 2021-12-30 DIAGNOSIS — Z3A24 24 weeks gestation of pregnancy: Secondary | ICD-10-CM

## 2021-12-30 DIAGNOSIS — O10912 Unspecified pre-existing hypertension complicating pregnancy, second trimester: Secondary | ICD-10-CM | POA: Insufficient documentation

## 2021-12-30 DIAGNOSIS — O285 Abnormal chromosomal and genetic finding on antenatal screening of mother: Secondary | ICD-10-CM | POA: Diagnosis not present

## 2021-12-30 DIAGNOSIS — O99212 Obesity complicating pregnancy, second trimester: Secondary | ICD-10-CM

## 2021-12-30 DIAGNOSIS — O09292 Supervision of pregnancy with other poor reproductive or obstetric history, second trimester: Secondary | ICD-10-CM

## 2021-12-30 DIAGNOSIS — Z362 Encounter for other antenatal screening follow-up: Secondary | ICD-10-CM

## 2021-12-30 DIAGNOSIS — O10012 Pre-existing essential hypertension complicating pregnancy, second trimester: Secondary | ICD-10-CM | POA: Diagnosis not present

## 2021-12-30 DIAGNOSIS — O10919 Unspecified pre-existing hypertension complicating pregnancy, unspecified trimester: Secondary | ICD-10-CM

## 2021-12-30 DIAGNOSIS — E669 Obesity, unspecified: Secondary | ICD-10-CM

## 2021-12-30 DIAGNOSIS — O2622 Pregnancy care for patient with recurrent pregnancy loss, second trimester: Secondary | ICD-10-CM

## 2021-12-30 DIAGNOSIS — O0992 Supervision of high risk pregnancy, unspecified, second trimester: Secondary | ICD-10-CM

## 2022-01-01 ENCOUNTER — Encounter: Payer: Self-pay | Admitting: Cardiology

## 2022-01-01 ENCOUNTER — Ambulatory Visit (INDEPENDENT_AMBULATORY_CARE_PROVIDER_SITE_OTHER): Payer: BC Managed Care – PPO | Admitting: Cardiology

## 2022-01-01 VITALS — BP 128/82 | HR 102 | Ht 68.0 in | Wt 277.6 lb

## 2022-01-01 DIAGNOSIS — O10919 Unspecified pre-existing hypertension complicating pregnancy, unspecified trimester: Secondary | ICD-10-CM | POA: Diagnosis not present

## 2022-01-01 DIAGNOSIS — O99210 Obesity complicating pregnancy, unspecified trimester: Secondary | ICD-10-CM

## 2022-01-01 NOTE — Progress Notes (Signed)
Cardio-Obstetrics Clinic  Follow Up Note   Date:  01/01/2022   ID:  Susan Sellers, DOB 05-Dec-1990, MRN [id number]  PCP:  Patient, No Pcp Per   [facility] Providers Cardiologist:  Thomasene Ripple, DO  Electrophysiologist:  None        Referring MD: No ref. provider found   Chief Complaint: " I am doing well"  History of Present Illness:    Susan Sellers is a 31 y.o. female [G4P0030] who returns for follow up of visit.  Medical history includes hypertension, premature family history of coronary artery disease in her mother who had her first MI at the age of 30, obesity here today to be evaluated for elevated blood pressure.  I saw the patient on October 20, 2021 at that time her blood pressure was within target.  No changes were made with her medicine.  Since I saw the patient she has had another cerclage.   Prior CV Studies Reviewed: The following studies were reviewed today:   Past Medical History:  Diagnosis Date   Hypertension     Past Surgical History:  Procedure Laterality Date   CERVICAL CERCLAGE N/A 11/24/2021   Procedure: CERCLAGE CERVICAL;  Surgeon: Catalina Antigua, MD;  Location: [facility] LD [facility];  Service: Gynecology;  Laterality: N/A;   CERVICAL CERCLAGE N/A 12/12/2021   Procedure: CERCLAGE CERVICAL;  Surgeon: Noralee Space, MD;  Location: [facility] LD ORS;  Service: Gynecology;  Laterality: N/A;   WISDOM TOOTH EXTRACTION        OB History     Gravida  4   Para  0   Term  0   Preterm  0   AB  3   Living  0      SAB  2   IAB  0   Ectopic  0   Multiple  0   Live Births  0               Current Medications: Current Meds  Medication Sig   acetaminophen (TYLENOL) 325 MG tablet Take 2 tablets (650 mg total) by mouth every 4 (four) hours as needed (for pain scale < 4  OR  temperature  >/=  100.5 F).   aspirin EC 81 MG tablet Take 1 tablet (81 mg total) by mouth daily. Take after 12 weeks for prevention of preeclampsia later in pregnancy (Patient  taking differently: Take 81 mg by mouth daily at 12 noon. Take after 12 weeks for prevention of preeclampsia later in pregnancy)   Elastic Bandages & Supports (COMFORT FIT MATERNITY SUPP MED) MISC Wear daily when ambulating   ondansetron (ZOFRAN-ODT) 4 MG disintegrating tablet Take 1 tablet (4 mg total) by mouth every 6 (six) hours as needed for nausea.   Prenatal Vit-Fe Fumarate-FA (MULTIVITAMIN-PRENATAL) 27-0.8 MG TABS tablet Take 1 tablet by mouth daily at 12 noon.   progesterone (PROMETRIUM) 200 MG capsule Place one capsule vaginally at bedtime   promethazine (PHENERGAN) 25 MG tablet Take 1 tablet (25 mg total) by mouth every 6 (six) hours as needed for nausea or vomiting.     Allergies:   Patient has no known allergies.   Social History   Socioeconomic History   Marital status: Single    Spouse name: Not on file   Number of children: Not on file   Years of education: Not on file   Highest education level: Not on file  Occupational History   Not on file  Tobacco Use   Smoking status: Never  Smokeless tobacco: Never  Vaping Use   Vaping Use: Never used  Substance and Sexual Activity   Alcohol use: Not Currently    Alcohol/week: 0.0 standard drinks of alcohol   Drug use: Not Currently    Types: Marijuana    Comment: last used Nov 2022   Sexual activity: Yes    Partners: Male    Birth control/protection: None  Other Topics Concern   Not on file  Social History Narrative   Not on file   Social Determinants of Health   Financial Resource Strain: Not on file  Food Insecurity: No Food Insecurity (10/17/2021)   Hunger Vital Sign    Worried About Running Out of Food in the Last Year: Never true    Ran Out of Food in the Last Year: Never true  Transportation Needs: No Transportation Needs (10/17/2021)   PRAPARE - Administrator, Civil Service (Medical): No    Lack of Transportation (Non-Medical): No  Physical Activity: Not on file  Stress: Not on file  Social  Connections: Not on file      Family History  Problem Relation Age of Onset   Heart disease Mother    Hypertension Mother    Hypertension Maternal Grandmother    Hyperlipidemia Paternal Grandmother    Kidney failure Paternal Grandmother       ROS:   Please see the history of present illness.     All other systems reviewed and are negative.   Labs/EKG Reviewed:    EKG:   EKG is was not ordered today.    Recent Labs: 09/30/2021: ALT 9; BUN 5; Creatinine, Ser 0.62; Potassium 3.6; Sodium 135; TSH 0.681 12/12/2021: Hemoglobin 10.3; Platelets 266   Recent Lipid Panel No results found for: "CHOL", "TRIG", "HDL", "CHOLHDL", "LDLCALC", "LDLDIRECT"  Physical Exam:    VS:  BP 128/82   Pulse (!) 102   Ht 5\' 8"  (1.727 m)   Wt 277 lb 9.6 oz (125.9 kg)   LMP 07/10/2021 (Exact Date)   SpO2 99%   BMI 42.21 kg/m     Wt Readings from Last 3 Encounters:  01/01/22 277 lb 9.6 oz (125.9 kg)  12/23/21 269 lb (122 kg)  12/12/21 269 lb 11.2 oz (122.3 kg)     GEN:  Well nourished, well developed in no acute distress HEENT: Normal NECK: No JVD; No carotid bruits LYMPHATICS: No lymphadenopathy CARDIAC: RRR, no murmurs, rubs, gallops RESPIRATORY:  Clear to auscultation without rales, wheezing or rhonchi  ABDOMEN: Soft, non-tender, non-distended MUSCULOSKELETAL:  No edema; No deformity  SKIN: Warm and dry NEUROLOGIC:  Alert and oriented x 3 PSYCHIATRIC:  Normal affect    Risk Assessment/Risk Calculators:     CARPREG II Risk Prediction Index Score:  1.  The patient's risk for a primary cardiac event is 5%.            ASSESSMENT & PLAN:    Chronic hypertension affecting pregnancy Obesity in pregnancy  Clinically she appears to be doing well from a cardiovascular standpoint. We will continue aspirin 81 mg daily. I advised the patient take her blood pressure daily and to notify my office if anything goes above 140/90 mmHg.  The patient is in agreement with the above plan.  The patient left the office in stable condition.  The patient will follow up in 12 weeks or sooner if needed.  Patient Instructions  Medication Instructions:  Your physician recommends that you continue on your current medications as directed. Please refer  to the Current Medication list given to you today.  *If you need a refill on your cardiac medications before your next appointment, please call your pharmacy*   Lab Work: None If you have labs (blood work) drawn today and your tests are completely normal, you will receive your results only by: MyChart Message (if you have MyChart) OR A paper copy in the mail If you have any lab test that is abnormal or we need to change your treatment, we will call you to review the results.   Testing/Procedures: None   Follow-Up: At [facility], you and your health needs are our priority.  As part of our continuing mission to provide you with exceptional heart care, we have created designated Provider Care Teams.  These Care Teams include your primary Cardiologist (physician) and Advanced Practice Providers (APPs -  Physician Assistants and Nurse Practitioners) who all work together to provide you with the care you need, when you need it.  We recommend signing up for the patient portal called "MyChart".  Sign up information is provided on this After Visit Summary.  MyChart is used to connect with patients for Virtual Visits (Telemedicine).  Patients are able to view lab/test results, encounter notes, upcoming appointments, etc.  Non-urgent messages can be sent to your provider as well.   To learn more about what you can do with MyChart, go to [email or web address].    Your next appointment:   12 week(s)  The format for your next appointment:   In Person  Provider:   Thomasene Ripple, DO     Other Instructions   Important Information About Sugar         Dispo:  Return in about 12 weeks (around 03/26/2022).   Medication  Adjustments/Labs and Tests Ordered: Current medicines are reviewed at length with the patient today.  Concerns regarding medicines are outlined above.  Tests Ordered: No orders of the defined types were placed in this encounter.  Medication Changes: No orders of the defined types were placed in this encounter.

## 2022-01-01 NOTE — Patient Instructions (Signed)
Medication Instructions:  ?Your physician recommends that you continue on your current medications as directed. Please refer to the Current Medication list given to you today.  ?*If you need a refill on your cardiac medications before your next appointment, please call your pharmacy* ? ? ?Lab Work: ?None ?If you have labs (blood work) drawn today and your tests are completely normal, you will receive your results only by: ?MyChart Message (if you have MyChart) OR ?A paper copy in the mail ?If you have any lab test that is abnormal or we need to change your treatment, we will call you to review the results. ? ? ?Testing/Procedures: ?None ? ? ?Follow-Up: ?At [facility], you and your health needs are our priority.  As part of our continuing mission to provide you with exceptional heart care, we have created designated Provider Care Teams.  These Care Teams include your primary Cardiologist (physician) and Advanced Practice Providers (APPs -  Physician Assistants and Nurse Practitioners) who all work together to provide you with the care you need, when you need it. ? ?We recommend signing up for the patient portal called "MyChart".  Sign up information is provided on this After Visit Summary.  MyChart is used to connect with patients for Virtual Visits (Telemedicine).  Patients are able to view lab/test results, encounter notes, upcoming appointments, etc.  Non-urgent messages can be sent to your provider as well.   ?To learn more about what you can do with MyChart, go to [email or web address].   ? ?Your next appointment:   ?12 week(s) ? ?The format for your next appointment:   ?In Person ? ?Provider:   ?Kardie Tobb, DO   ? ? ?Other Instructions ? ? ?Important Information About Sugar ? ? ? ? ?  ?

## 2022-01-21 ENCOUNTER — Other Ambulatory Visit: Payer: BC Managed Care – PPO

## 2022-01-21 ENCOUNTER — Ambulatory Visit (INDEPENDENT_AMBULATORY_CARE_PROVIDER_SITE_OTHER): Payer: BC Managed Care – PPO | Admitting: Obstetrics & Gynecology

## 2022-01-21 VITALS — BP 117/81 | HR 111 | Wt 276.9 lb

## 2022-01-21 DIAGNOSIS — O09292 Supervision of pregnancy with other poor reproductive or obstetric history, second trimester: Secondary | ICD-10-CM

## 2022-01-21 DIAGNOSIS — O10912 Unspecified pre-existing hypertension complicating pregnancy, second trimester: Secondary | ICD-10-CM

## 2022-01-21 DIAGNOSIS — O99212 Obesity complicating pregnancy, second trimester: Secondary | ICD-10-CM

## 2022-01-21 DIAGNOSIS — O0992 Supervision of high risk pregnancy, unspecified, second trimester: Secondary | ICD-10-CM

## 2022-01-21 DIAGNOSIS — O10919 Unspecified pre-existing hypertension complicating pregnancy, unspecified trimester: Secondary | ICD-10-CM

## 2022-01-21 DIAGNOSIS — O3432 Maternal care for cervical incompetence, second trimester: Secondary | ICD-10-CM

## 2022-01-21 DIAGNOSIS — Z3A27 27 weeks gestation of pregnancy: Secondary | ICD-10-CM

## 2022-01-21 DIAGNOSIS — Z3403 Encounter for supervision of normal first pregnancy, third trimester: Secondary | ICD-10-CM

## 2022-01-21 NOTE — Progress Notes (Signed)
Patient presents for ROB. 28 week labs collected. Declines Tdap today, but requests more info. VIS given.

## 2022-01-21 NOTE — Progress Notes (Signed)
   PRENATAL VISIT NOTE  Subjective:  Susan Sellers is a 31 y.o. G4P0030 at [redacted]w[redacted]d being seen today for ongoing prenatal care.  She is currently monitored for the following issues for this high-risk pregnancy and has BMI 50.0-59.9, adult--morbid obesity; History of preterm premature rupture of membranes (PPROM) in previous pregnancy at 18 weeks, currently pregnant; Hypertension; Supervision of high-risk pregnancy; Maternal morbid obesity, antepartum (HCC); Chronic hypertension affecting pregnancy; Abnormal cervical Papanicolaou smear on 02/02/2021, normal colposcopy; Alpha thalassemia silent carrier; Incompetent cervix in pregnancy, antepartum, second trimester; and Cervical cerclage suture present in second trimester on their problem list.  Patient reports no complaints.  Contractions: Not present. Vag. Bleeding: None.  Movement: Present. Denies leaking of fluid.   The following portions of the patient's history were reviewed and updated as appropriate: allergies, current medications, past family history, past medical history, past social history, past surgical history and problem list.   Objective:   Vitals:   01/21/22 0835  BP: 117/81  Pulse: (!) 111  Weight: 276 lb 14.4 oz (125.6 kg)    Fetal Status: Fetal Heart Rate (bpm): 155   Movement: Present     General:  Alert, oriented and cooperative. Patient is in no acute distress.  Skin: Skin is warm and dry. No rash noted.   Cardiovascular: Normal heart rate noted  Respiratory: Normal respiratory effort, no problems with respiration noted  Abdomen: Soft, gravid, appropriate for gestational age.  Pain/Pressure: Absent     Pelvic: Cervical exam deferred        Extremities: Normal range of motion.  Edema: None  Mental Status: Normal mood and affect. Normal behavior. Normal judgment and thought content.   Assessment and Plan:  Pregnancy: G4P0030 at [redacted]w[redacted]d 1. Supervision of high risk pregnancy in second trimester Thrid trimester  2.  Cervical cerclage suture present in second trimester Doing well  3. Incompetent cervix in , antepartum, second trimester [redacted]w[redacted]d   4. Chronic hypertension affecting pregnancy Nl BP  5. Maternal morbid obesity, antepartum (HCC)   6. History of preterm premature rupture of membranes (PROM) in previous pregnancy, currently pregnant in second trimester   Preterm labor symptoms and general obstetric precautions including but not limited to vaginal bleeding, contractions, leaking of fluid and fetal movement were reviewed in detail with the patient. Please refer to After Visit Summary for other counseling recommendations.   Return in about 2 weeks (around 02/04/2022).  Future Appointments  Date Time Provider Department Center  01/27/2022  7:45 AM [facility] NURSE [facility]  01/27/2022  8:00 AM [facility] US1 [facility]  03/25/2022  9:00 AM Tobb, Lavona [place], DO [facility] None    Scheryl Darter, MD

## 2022-01-22 LAB — CBC
Hematocrit: 30.7 % — ABNORMAL LOW (ref 34.0–46.6)
Hemoglobin: 9.7 g/dL — ABNORMAL LOW (ref 11.1–15.9)
MCH: 26.6 pg (ref 26.6–33.0)
MCHC: 31.6 g/dL (ref 31.5–35.7)
MCV: 84 fL (ref 79–97)
Platelets: 295 10*3/uL (ref 150–450)
RBC: 3.65 x10E6/uL — ABNORMAL LOW (ref 3.77–5.28)
RDW: 13.1 % (ref 11.7–15.4)
WBC: 12.6 10*3/uL — ABNORMAL HIGH (ref 3.4–10.8)

## 2022-01-22 LAB — GLUCOSE TOLERANCE, 2 HOURS W/ 1HR
Glucose, 1 hour: 135 mg/dL (ref 70–179)
Glucose, 2 hour: 111 mg/dL (ref 70–152)
Glucose, Fasting: 81 mg/dL (ref 70–91)

## 2022-01-22 LAB — RPR: RPR Ser Ql: NONREACTIVE

## 2022-01-22 LAB — HIV ANTIBODY (ROUTINE TESTING W REFLEX): HIV Screen 4th Generation wRfx: NONREACTIVE

## 2022-01-27 ENCOUNTER — Ambulatory Visit: Payer: BC Managed Care – PPO | Admitting: *Deleted

## 2022-01-27 ENCOUNTER — Other Ambulatory Visit: Payer: Self-pay | Admitting: *Deleted

## 2022-01-27 ENCOUNTER — Ambulatory Visit: Payer: BC Managed Care – PPO | Attending: Obstetrics and Gynecology

## 2022-01-27 VITALS — BP 119/73 | HR 94

## 2022-01-27 DIAGNOSIS — O99212 Obesity complicating pregnancy, second trimester: Secondary | ICD-10-CM | POA: Insufficient documentation

## 2022-01-27 DIAGNOSIS — O3432 Maternal care for cervical incompetence, second trimester: Secondary | ICD-10-CM | POA: Insufficient documentation

## 2022-01-27 DIAGNOSIS — O09293 Supervision of pregnancy with other poor reproductive or obstetric history, third trimester: Secondary | ICD-10-CM | POA: Insufficient documentation

## 2022-01-27 DIAGNOSIS — D563 Thalassemia minor: Secondary | ICD-10-CM

## 2022-01-27 DIAGNOSIS — O0993 Supervision of high risk pregnancy, unspecified, third trimester: Secondary | ICD-10-CM | POA: Insufficient documentation

## 2022-01-27 DIAGNOSIS — O99210 Obesity complicating pregnancy, unspecified trimester: Secondary | ICD-10-CM

## 2022-01-27 DIAGNOSIS — Z362 Encounter for other antenatal screening follow-up: Secondary | ICD-10-CM | POA: Insufficient documentation

## 2022-02-05 ENCOUNTER — Encounter: Payer: Self-pay | Admitting: Obstetrics and Gynecology

## 2022-02-05 ENCOUNTER — Ambulatory Visit (INDEPENDENT_AMBULATORY_CARE_PROVIDER_SITE_OTHER): Payer: BC Managed Care – PPO | Admitting: Obstetrics and Gynecology

## 2022-02-05 VITALS — BP 109/72 | HR 88 | Wt 282.0 lb

## 2022-02-05 DIAGNOSIS — Z3A30 30 weeks gestation of pregnancy: Secondary | ICD-10-CM

## 2022-02-05 DIAGNOSIS — O3432 Maternal care for cervical incompetence, second trimester: Secondary | ICD-10-CM

## 2022-02-05 DIAGNOSIS — O3433 Maternal care for cervical incompetence, third trimester: Secondary | ICD-10-CM

## 2022-02-05 DIAGNOSIS — O99213 Obesity complicating pregnancy, third trimester: Secondary | ICD-10-CM

## 2022-02-05 DIAGNOSIS — O0993 Supervision of high risk pregnancy, unspecified, third trimester: Secondary | ICD-10-CM

## 2022-02-05 DIAGNOSIS — O10919 Unspecified pre-existing hypertension complicating pregnancy, unspecified trimester: Secondary | ICD-10-CM

## 2022-02-05 DIAGNOSIS — O10913 Unspecified pre-existing hypertension complicating pregnancy, third trimester: Secondary | ICD-10-CM

## 2022-02-05 NOTE — Progress Notes (Signed)
Pt presents ROB without complaints.

## 2022-02-05 NOTE — Progress Notes (Signed)
   PRENATAL VISIT NOTE  Subjective:  Susan Sellers is a 31 y.o. G4P0030 at [redacted]w[redacted]d being seen today for ongoing prenatal care.  She is currently monitored for the following issues for this high-risk pregnancy and has BMI 50.0-59.9, adult--morbid obesity; History of preterm premature rupture of membranes (PPROM) in previous pregnancy at 49 weeks, currently pregnant; Hypertension; Supervision of high-risk pregnancy; Maternal morbid obesity, antepartum ([place]); Chronic hypertension affecting pregnancy; Abnormal cervical Papanicolaou smear on 02/02/2021, normal colposcopy; Alpha thalassemia silent carrier; Incompetent cervix in pregnancy, antepartum, second trimester; and Cervical cerclage suture present in second trimester on their problem list.  Patient reports no complaints.  Contractions: Not present. Vag. Bleeding: None.  Movement: Present. Denies leaking of fluid.   The following portions of the patient's history were reviewed and updated as appropriate: allergies, current medications, past family history, past medical history, past social history, past surgical history and problem list.   Objective:   Vitals:   02/05/22 0851  BP: 109/72  Pulse: 88  Weight: 282 lb (127.9 kg)    Fetal Status: Fetal Heart Rate (bpm): 150 Fundal Height: 30 cm Movement: Present     General:  Alert, oriented and cooperative. Patient is in no acute distress.  Skin: Skin is warm and dry. No rash noted.   Cardiovascular: Normal heart rate noted  Respiratory: Normal respiratory effort, no problems with respiration noted  Abdomen: Soft, gravid, appropriate for gestational age.  Pain/Pressure: Absent     Pelvic: Cervical exam deferred        Extremities: Normal range of motion.  Edema: None  Mental Status: Normal mood and affect. Normal behavior. Normal judgment and thought content.   Assessment and Plan:  Pregnancy: [id number] at [redacted]w[redacted]d 1. Supervision of high risk pregnancy in third trimester Patient is doing well  without complaints Still researching pediatrician Undecided on contraception  2. Chronic hypertension affecting pregnancy Stable Continue ASA  3. Cervical cerclage suture present in second trimester Plan for removal around 36-37 weeks  4. Maternal morbid obesity, antepartum ([place])   Preterm labor symptoms and general obstetric precautions including but not limited to vaginal bleeding, contractions, leaking of fluid and fetal movement were reviewed in detail with the patient. Please refer to After Visit Summary for other counseling recommendations.   Return in about 2 weeks (around 02/19/2022) for in person, ROB, High risk.  Future Appointments  Date Time Provider [place]  02/26/2022  7:30 AM [facility] NURSE [facility]  02/26/2022  7:45 AM [facility] US4 [facility]  03/12/2022  7:30 AM [facility] NURSE [facility]  03/12/2022  7:45 AM [facility] US4 [facility]  03/19/2022  7:45 AM [facility] NURSE [facility]  03/19/2022  8:00 AM [facility] US1 [facility] [place]  03/25/2022  9:00 AM Tobb, Godfrey Pick, DO [facility] None    Mora Bellman, MD

## 2022-02-09 ENCOUNTER — Encounter: Payer: Self-pay | Admitting: Obstetrics and Gynecology

## 2022-02-11 ENCOUNTER — Telehealth: Payer: Self-pay | Admitting: *Deleted

## 2022-02-11 NOTE — Telephone Encounter (Signed)
TC to clarify needs for healthcare provider questionnaire for [facility] (employer leave form).

## 2022-02-19 ENCOUNTER — Ambulatory Visit (INDEPENDENT_AMBULATORY_CARE_PROVIDER_SITE_OTHER): Payer: BC Managed Care – PPO | Admitting: Obstetrics & Gynecology

## 2022-02-19 ENCOUNTER — Encounter: Payer: Self-pay | Admitting: Obstetrics & Gynecology

## 2022-02-19 VITALS — BP 102/71 | HR 101 | Wt 272.0 lb

## 2022-02-19 DIAGNOSIS — O0993 Supervision of high risk pregnancy, unspecified, third trimester: Secondary | ICD-10-CM

## 2022-02-19 DIAGNOSIS — O3433 Maternal care for cervical incompetence, third trimester: Secondary | ICD-10-CM

## 2022-02-19 DIAGNOSIS — O10913 Unspecified pre-existing hypertension complicating pregnancy, third trimester: Secondary | ICD-10-CM

## 2022-02-19 DIAGNOSIS — Z3A32 32 weeks gestation of pregnancy: Secondary | ICD-10-CM

## 2022-02-19 DIAGNOSIS — O10919 Unspecified pre-existing hypertension complicating pregnancy, unspecified trimester: Secondary | ICD-10-CM

## 2022-02-19 NOTE — Progress Notes (Signed)
   PRENATAL VISIT NOTE  Subjective:  Susan Sellers is a 31 y.o. G4P0030 at [redacted]w[redacted]d being seen today for ongoing prenatal care.  She is currently monitored for the following issues for this high-risk pregnancy and has BMI 50.0-59.9, adult--morbid obesity; History of preterm premature rupture of membranes (PPROM) in previous pregnancy at 57 weeks, currently pregnant; Hypertension; Supervision of high-risk pregnancy; Maternal morbid obesity, antepartum ([place]); Chronic hypertension affecting pregnancy; Abnormal cervical Papanicolaou smear on 02/02/2021, normal colposcopy; Alpha thalassemia silent carrier; Incompetent cervix in pregnancy, antepartum, second trimester; and Cervical cerclage suture present on their problem list.  Patient reports no complaints.  Contractions: Not present. Vag. Bleeding: None.  Movement: Present. Denies leaking of fluid.   The following portions of the patient's history were reviewed and updated as appropriate: allergies, current medications, past family history, past medical history, past social history, past surgical history and problem list.   Objective:   Vitals:   02/19/22 0842  BP: 102/71  Pulse: (!) 101  Weight: 272 lb (123.4 kg)    Fetal Status: Fetal Heart Rate (bpm): 145   Movement: Present     General:  Alert, oriented and cooperative. Patient is in no acute distress.  Skin: Skin is warm and dry. No rash noted.   Cardiovascular: Normal heart rate noted  Respiratory: Normal respiratory effort, no problems with respiration noted  Abdomen: Soft, gravid, appropriate for gestational age.  Pain/Pressure: Absent     Pelvic: Cervical exam deferred        Extremities: Normal range of motion.  Edema: None  Mental Status: Normal mood and affect. Normal behavior. Normal judgment and thought content.   Assessment and Plan:  Pregnancy: [id number] at [redacted]w[redacted]d 1. Chronic hypertension affecting pregnancy Stable BP, no medications. Doing well. Continue ASA.  2. Cervical  cerclage suture present in third trimester Will be removed around 37 weeks or 36 weeks if needed.  3. [redacted] weeks gestation of pregnancy 4. Supervision of high risk pregnancy in third trimester No other concerns. Preterm labor symptoms and general obstetric precautions including but not limited to vaginal bleeding, contractions, leaking of fluid and fetal movement were reviewed in detail with the patient. Please refer to After Visit Summary for other counseling recommendations.   Return in about 2 weeks (around 03/05/2022) for OFFICE OB VISIT (MD only).  Future Appointments  Date Time Provider [place]  02/26/2022  7:30 AM [facility] NURSE [facility]  02/26/2022  7:45 AM [facility] US4 [facility]  03/12/2022  7:30 AM [facility] NURSE [facility]  03/12/2022  7:45 AM [facility] US4 [facility]  03/19/2022  7:45 AM [facility] NURSE [facility] [place]  03/19/2022  8:00 AM [facility] US1 [facility] [place]  03/25/2022  9:00 AM Tobb, Godfrey Pick, DO [facility] None    Verita Schneiders, MD

## 2022-02-19 NOTE — Patient Instructions (Signed)
Return to office for any scheduled appointments. Call the office or go to the MAU at [facility] if: You begin to have strong, frequent contractions Your water breaks.  Sometimes it is a big gush of fluid, sometimes it is just a trickle that keeps getting your underwear wet or running down your legs You have vaginal bleeding.  It is normal to have a small amount of spotting if your cervix was checked.  You do not feel your baby moving like normal.  If you do not, get something to eat and drink and lay down and focus on feeling your baby move.   If your baby is still not moving like normal, you should call the office or go to MAU. Any other obstetric concerns.  

## 2022-02-26 ENCOUNTER — Other Ambulatory Visit: Payer: Self-pay | Admitting: *Deleted

## 2022-02-26 ENCOUNTER — Ambulatory Visit: Payer: BC Managed Care – PPO | Attending: Obstetrics and Gynecology

## 2022-02-26 ENCOUNTER — Encounter: Payer: Self-pay | Admitting: *Deleted

## 2022-02-26 ENCOUNTER — Ambulatory Visit: Payer: BC Managed Care – PPO | Admitting: *Deleted

## 2022-02-26 VITALS — BP 125/78 | HR 120

## 2022-02-26 DIAGNOSIS — O3433 Maternal care for cervical incompetence, third trimester: Secondary | ICD-10-CM

## 2022-02-26 DIAGNOSIS — Z362 Encounter for other antenatal screening follow-up: Secondary | ICD-10-CM

## 2022-02-26 DIAGNOSIS — O99210 Obesity complicating pregnancy, unspecified trimester: Secondary | ICD-10-CM | POA: Insufficient documentation

## 2022-02-26 DIAGNOSIS — O10013 Pre-existing essential hypertension complicating pregnancy, third trimester: Secondary | ICD-10-CM | POA: Diagnosis not present

## 2022-02-26 DIAGNOSIS — D563 Thalassemia minor: Secondary | ICD-10-CM | POA: Diagnosis not present

## 2022-02-26 DIAGNOSIS — O0993 Supervision of high risk pregnancy, unspecified, third trimester: Secondary | ICD-10-CM

## 2022-02-26 DIAGNOSIS — Z3A33 33 weeks gestation of pregnancy: Secondary | ICD-10-CM

## 2022-02-26 DIAGNOSIS — O10919 Unspecified pre-existing hypertension complicating pregnancy, unspecified trimester: Secondary | ICD-10-CM

## 2022-02-26 DIAGNOSIS — O09293 Supervision of pregnancy with other poor reproductive or obstetric history, third trimester: Secondary | ICD-10-CM

## 2022-02-26 DIAGNOSIS — O99213 Obesity complicating pregnancy, third trimester: Secondary | ICD-10-CM

## 2022-02-26 DIAGNOSIS — E669 Obesity, unspecified: Secondary | ICD-10-CM | POA: Diagnosis not present

## 2022-03-03 ENCOUNTER — Encounter: Payer: BC Managed Care – PPO | Admitting: Obstetrics and Gynecology

## 2022-03-05 ENCOUNTER — Other Ambulatory Visit: Payer: Self-pay | Admitting: Obstetrics

## 2022-03-05 ENCOUNTER — Ambulatory Visit (HOSPITAL_BASED_OUTPATIENT_CLINIC_OR_DEPARTMENT_OTHER): Payer: BC Managed Care – PPO | Admitting: Obstetrics

## 2022-03-05 ENCOUNTER — Ambulatory Visit: Payer: BC Managed Care – PPO | Admitting: *Deleted

## 2022-03-05 ENCOUNTER — Encounter (HOSPITAL_COMMUNITY): Payer: Self-pay | Admitting: Obstetrics & Gynecology

## 2022-03-05 ENCOUNTER — Inpatient Hospital Stay (HOSPITAL_COMMUNITY)
Admission: AD | Admit: 2022-03-05 | Discharge: 2022-03-05 | Disposition: A | Discharge status: Home / Self Care | Payer: BC Managed Care – PPO | Attending: Obstetrics & Gynecology | Admitting: Obstetrics & Gynecology

## 2022-03-05 ENCOUNTER — Ambulatory Visit (HOSPITAL_BASED_OUTPATIENT_CLINIC_OR_DEPARTMENT_OTHER): Payer: BC Managed Care – PPO

## 2022-03-05 VITALS — BP 112/73 | HR 94

## 2022-03-05 DIAGNOSIS — Z3689 Encounter for other specified antenatal screening: Secondary | ICD-10-CM | POA: Diagnosis not present

## 2022-03-05 DIAGNOSIS — Z3A34 34 weeks gestation of pregnancy: Secondary | ICD-10-CM

## 2022-03-05 DIAGNOSIS — O10919 Unspecified pre-existing hypertension complicating pregnancy, unspecified trimester: Secondary | ICD-10-CM

## 2022-03-05 DIAGNOSIS — O99213 Obesity complicating pregnancy, third trimester: Secondary | ICD-10-CM

## 2022-03-05 DIAGNOSIS — O0993 Supervision of high risk pregnancy, unspecified, third trimester: Secondary | ICD-10-CM | POA: Insufficient documentation

## 2022-03-05 DIAGNOSIS — D563 Thalassemia minor: Secondary | ICD-10-CM | POA: Insufficient documentation

## 2022-03-05 DIAGNOSIS — O99113 Other diseases of the blood and blood-forming organs and certain disorders involving the immune mechanism complicating pregnancy, third trimester: Secondary | ICD-10-CM | POA: Insufficient documentation

## 2022-03-05 DIAGNOSIS — O09293 Supervision of pregnancy with other poor reproductive or obstetric history, third trimester: Secondary | ICD-10-CM | POA: Insufficient documentation

## 2022-03-05 DIAGNOSIS — Z8249 Family history of ischemic heart disease and other diseases of the circulatory system: Secondary | ICD-10-CM | POA: Insufficient documentation

## 2022-03-05 DIAGNOSIS — O99323 Drug use complicating pregnancy, third trimester: Secondary | ICD-10-CM | POA: Insufficient documentation

## 2022-03-05 DIAGNOSIS — O10913 Unspecified pre-existing hypertension complicating pregnancy, third trimester: Secondary | ICD-10-CM | POA: Insufficient documentation

## 2022-03-05 DIAGNOSIS — O3433 Maternal care for cervical incompetence, third trimester: Secondary | ICD-10-CM | POA: Diagnosis not present

## 2022-03-05 NOTE — MAU Note (Signed)
.  Susan Sellers is a 31 y.o. at [redacted]w[redacted]d here in MAU reporting: sent for prolonged monitoring for non reactive NST and 6/8 BPP. Pt had not eaten prior appointment. Has gotten something to eat and is here to retest. Pt stated she is feeling baby move.  LMP: \ Onset of complaint:  Pain score: 0 There were no vitals filed for this visit.   FHT:170 Lab orders placed from triage:

## 2022-03-05 NOTE — Progress Notes (Signed)
MFM Note  Susan Sellers was seen for a BPP due to chronic hypertension, maternal obesity, and cervical incompetence with a cerclage in place.  She denies any problems since her last exam.  A BPP performed today was 6 out of 10.  She also received a -2 for fetal breathing movements that did not meet criteria and another -2 for nonreactive NST.  The NST was nonreactive despite being monitored for over an hour.  Due to a BPP of 6 out of 10, the patient was sent to the MAU for prolonged monitoring.    Fetal kick count instructions were reviewed.    She will return in 1 week for another BPP in our office should she be discharged home from the MAU.    The patient stated that all of her questions were answered today.  A total of 20 minutes was spent counseling and coordinating the care for this patient.  Greater than 50% of the time was spent in direct face-to-face contact.

## 2022-03-05 NOTE — Procedures (Signed)
Susan Sellers Oct 14, 1990 [redacted]w[redacted]d  Fetus A Non-Stress Test Interpretation for 03/05/22  Indication: Unsatisfactory BPP and obese  Fetal Heart Rate A Mode: External Baseline Rate (A): 140 bpm Variability: Minimal, Moderate (audible arrythmia) Accelerations: 10 x 10 Decelerations: Variable Multiple birth?: No  Uterine Activity Mode: Toco Contraction Frequency (min): none Resting Tone Palpated: Relaxed  Interpretation (Fetal Testing) Nonstress Test Interpretation: Non-reactive Overall Impression: Reassuring for gestational age Comments: tracing reviewed by Dr. Annamaria Boots

## 2022-03-05 NOTE — MAU Provider Note (Signed)
History     CSN: [id number]  Arrival date and time: 03/05/22 1054   Event Date/Time   First Provider Initiated Contact with Patient 03/05/22 1230      Chief Complaint  Patient presents with   Non-stress Test   HPI Susan Sellers is a 31 y.o. G4P0030 at [redacted]w[redacted]d . She presents to MAU for prolonged monitoring following BPP 6/8 and NR tracing x 1 hour. Patient states she ate at the end of her MFM appointment. She denies vaginal bleeding, leaking of fluid, decreased fetal movement, fever, falls, or recent illness.   OB History     Gravida  4   Para  0   Term  0   Preterm  0   AB  3   Living  0      SAB  2   IAB  0   Ectopic  0   Multiple  0   Live Births  0           Past Medical History:  Diagnosis Date   Hypertension     Past Surgical History:  Procedure Laterality Date   CERVICAL CERCLAGE N/A 11/24/2021   Procedure: CERCLAGE CERVICAL;  Surgeon: Mora Bellman, MD;  [facility];  Service: Gynecology;  Laterality: N/A;   CERVICAL CERCLAGE N/A 12/12/2021   Procedure: CERCLAGE CERVICAL;  Surgeon: Tama High, MD;  Location: [facility];  Service: Gynecology;  Laterality: N/A;   WISDOM TOOTH EXTRACTION      Family History  Problem Relation Age of Onset   Heart disease Mother    Hypertension Mother    Hypertension Maternal Grandmother    Hyperlipidemia Paternal Grandmother    Kidney failure Paternal Grandmother     Social History   Tobacco Use   Smoking status: Never   Smokeless tobacco: Never  Vaping Use   Vaping Use: Never used  Substance Use Topics   Alcohol use: Not Currently    Alcohol/week: 0.0 standard drinks of alcohol   Drug use: Not Currently    Types: Marijuana    Comment: last used Nov 2022    Allergies: No Known Allergies  Medications Prior to Admission  Medication Sig Dispense Refill Last Dose   aspirin EC 81 MG tablet Take 1 tablet (81 mg total) by mouth daily. Take after 12 weeks for prevention of preeclampsia  later in pregnancy (Patient taking differently: Take 81 mg by mouth daily at 12 noon. Take after 12 weeks for prevention of preeclampsia later in pregnancy) 300 tablet 2 03/04/2022   Prenatal Vit-Fe Fumarate-FA (MULTIVITAMIN-PRENATAL) 27-0.8 MG TABS tablet Take 1 tablet by mouth daily at 12 noon.   03/04/2022   progesterone (PROMETRIUM) 200 MG capsule Place one capsule vaginally at bedtime 30 capsule 3 03/04/2022   acetaminophen (TYLENOL) 325 MG tablet Take 2 tablets (650 mg total) by mouth every 4 (four) hours as needed (for pain scale < 4  OR  temperature  >/=  100.5 F).   Unknown   Elastic Bandages & Supports (COMFORT FIT MATERNITY SUPP MED) MISC Wear daily when ambulating 1 each 0     Review of Systems  All other systems reviewed and are negative.  Physical Exam   Blood pressure 130/84, pulse 99, temperature 99 F (37.2 C), resp. rate 18, last menstrual period 07/10/2021, SpO2 98 %, unknown if currently breastfeeding.  Physical Exam Vitals and nursing note reviewed. Exam conducted with a chaperone present.  Constitutional:      Appearance: She is not  ill-appearing.  Cardiovascular:     Rate and Rhythm: Normal rate.  Pulmonary:     Effort: Pulmonary effort is normal.  Abdominal:     Comments: Gravid  Skin:    Capillary Refill: Capillary refill takes less than 2 seconds.  Neurological:     Mental Status: She is alert and oriented to person, place, and time.  Psychiatric:        Mood and Affect: Mood normal.        Behavior: Behavior normal.        Thought Content: Thought content normal.        Judgment: Judgment normal.     MAU Course  Procedures  MDM Patient Vitals for the past 24 hrs:  BP Temp Pulse Resp SpO2  03/05/22 1255 125/77 -- 91 -- 98 %  03/05/22 1154 -- -- -- -- 98 %  03/05/22 1123 130/84 99 F (37.2 C) 99 18 --     Assessment and Plan  --31 y.o. G4P0030 at [redacted]w[redacted]d  --Reactive tracing in MAU (Baseline 150, mod var, +15 x 15 accels, no decels) --Toco:  quiet --Tracing reviewed by Dr. Charlotta Newton prior to discharge --Discharge home in stable condition  F/U: --Patient's next appointment with [facility] is 03/08/2022  Calvert Cantor, MSA, MSN, CNM 03/05/2022, 2:01 PM

## 2022-03-08 ENCOUNTER — Ambulatory Visit (INDEPENDENT_AMBULATORY_CARE_PROVIDER_SITE_OTHER): Payer: BC Managed Care – PPO | Admitting: Obstetrics and Gynecology

## 2022-03-08 ENCOUNTER — Encounter: Payer: Self-pay | Admitting: Obstetrics and Gynecology

## 2022-03-08 VITALS — BP 124/87 | HR 108 | Wt 290.2 lb

## 2022-03-08 DIAGNOSIS — O3433 Maternal care for cervical incompetence, third trimester: Secondary | ICD-10-CM

## 2022-03-08 DIAGNOSIS — Z3A34 34 weeks gestation of pregnancy: Secondary | ICD-10-CM

## 2022-03-08 DIAGNOSIS — O0993 Supervision of high risk pregnancy, unspecified, third trimester: Secondary | ICD-10-CM

## 2022-03-08 DIAGNOSIS — O10919 Unspecified pre-existing hypertension complicating pregnancy, unspecified trimester: Secondary | ICD-10-CM

## 2022-03-08 DIAGNOSIS — O10913 Unspecified pre-existing hypertension complicating pregnancy, third trimester: Secondary | ICD-10-CM

## 2022-03-08 NOTE — Progress Notes (Signed)
   PRENATAL VISIT NOTE  Subjective:  Susan Sellers is a 31 y.o. G4P0030 at [redacted]w[redacted]d being seen today for ongoing prenatal care.  She is currently monitored for the following issues for this high-risk pregnancy and has BMI 50.0-59.9, adult--morbid obesity; History of preterm premature rupture of membranes (PPROM) in previous pregnancy at 78 weeks, currently pregnant; Hypertension; Supervision of high-risk pregnancy; Maternal morbid obesity, antepartum ([name]); Chronic hypertension affecting pregnancy; Abnormal cervical Papanicolaou smear on 02/02/2021, normal colposcopy; Alpha thalassemia silent carrier; Incompetent cervix in pregnancy, antepartum, second trimester; and Cervical cerclage suture present on their problem list.  Patient reports no bleeding, no contractions, no cramping, and no leaking.  Contractions: Not present. Vag. Bleeding: None.  Movement: Present. Denies leaking of fluid.   The following portions of the patient's history were reviewed and updated as appropriate: allergies, current medications, past family history, past medical history, past social history, past surgical history and problem list.   Objective:   Vitals:   03/08/22 1538  BP: 124/87  Pulse: (!) 108  Weight: 290 lb 3.2 oz (131.6 kg)    Fetal Status: Fetal Heart Rate (bpm): 156 Fundal Height: 35 cm Movement: Present     General:  Alert, oriented and cooperative. Patient is in no acute distress.  Skin: Skin is warm and dry. No rash noted.   Cardiovascular: Normal heart rate noted  Respiratory: Normal respiratory effort, no problems with respiration noted  Abdomen: Soft, gravid, appropriate for gestational age.  Pain/Pressure: Present     Pelvic: Cervical exam deferred        Extremities: Normal range of motion.     Mental Status: Normal mood and affect. Normal behavior. Normal judgment and thought content.   Assessment and Plan:  Pregnancy: [id number] at [redacted]w[redacted]d 1. Supervision of high risk pregnancy in third  trimester Continue MFM [place] surveillance  2. Cervical cerclage suture present in third trimester Removal 36-37 weeks  3. Chronic hypertension affecting pregnancy Continue ASA  4. [redacted] weeks gestation of pregnancy Reviewed practice model Answered questions regarding labor, L&D logistics, etc Currently compiling birth plan   Preterm labor symptoms and general obstetric precautions including but not limited to vaginal bleeding, contractions, leaking of fluid and fetal movement were reviewed in detail with the patient. Please refer to After Visit Summary for other counseling recommendations.   No follow-ups on file.  Future Appointments  Date Time Provider [place]  03/12/2022  7:30 AM [facility] NURSE [facility]  03/12/2022  7:45 AM [facility] US4 [facility]  03/19/2022  7:45 AM [facility] NURSE [facility] [place]  03/19/2022  8:00 AM [facility] US1 [facility]  03/25/2022  9:00 AM Tobb, Kardie, DO [facility] None  03/26/2022  8:15 AM [facility] NURSE [facility]  03/26/2022  8:30 AM [facility] US2 [facility]  03/29/2022  3:30 PM Constant, Vickii Chafe, MD [place] None  04/05/2022  3:30 PM Leftwich-Kirby, Kathie Dike, CNM [facility] None  04/12/2022  3:50 PM Constant, Vickii Chafe, MD [place] None  04/19/2022  3:30 PM Constant, Vickii Chafe, MD [place] None    Darliss Cheney, MD

## 2022-03-12 ENCOUNTER — Ambulatory Visit: Payer: BC Managed Care – PPO | Attending: Obstetrics and Gynecology

## 2022-03-12 ENCOUNTER — Ambulatory Visit: Payer: BC Managed Care – PPO | Admitting: *Deleted

## 2022-03-12 VITALS — BP 124/72 | HR 96

## 2022-03-12 DIAGNOSIS — O99210 Obesity complicating pregnancy, unspecified trimester: Secondary | ICD-10-CM

## 2022-03-12 DIAGNOSIS — O0993 Supervision of high risk pregnancy, unspecified, third trimester: Secondary | ICD-10-CM

## 2022-03-12 DIAGNOSIS — D563 Thalassemia minor: Secondary | ICD-10-CM

## 2022-03-12 DIAGNOSIS — O09293 Supervision of pregnancy with other poor reproductive or obstetric history, third trimester: Secondary | ICD-10-CM | POA: Insufficient documentation

## 2022-03-19 ENCOUNTER — Other Ambulatory Visit: Payer: Self-pay | Admitting: *Deleted

## 2022-03-19 ENCOUNTER — Other Ambulatory Visit: Payer: Self-pay | Admitting: Obstetrics and Gynecology

## 2022-03-19 ENCOUNTER — Ambulatory Visit: Payer: BC Managed Care – PPO | Attending: Obstetrics and Gynecology

## 2022-03-19 ENCOUNTER — Ambulatory Visit: Payer: BC Managed Care – PPO | Admitting: *Deleted

## 2022-03-19 VITALS — BP 129/72 | HR 98

## 2022-03-19 DIAGNOSIS — D563 Thalassemia minor: Secondary | ICD-10-CM | POA: Diagnosis not present

## 2022-03-19 DIAGNOSIS — O09293 Supervision of pregnancy with other poor reproductive or obstetric history, third trimester: Secondary | ICD-10-CM | POA: Insufficient documentation

## 2022-03-19 DIAGNOSIS — O99210 Obesity complicating pregnancy, unspecified trimester: Secondary | ICD-10-CM | POA: Insufficient documentation

## 2022-03-19 DIAGNOSIS — O10913 Unspecified pre-existing hypertension complicating pregnancy, third trimester: Secondary | ICD-10-CM

## 2022-03-19 DIAGNOSIS — O0993 Supervision of high risk pregnancy, unspecified, third trimester: Secondary | ICD-10-CM | POA: Diagnosis not present

## 2022-03-19 DIAGNOSIS — O99213 Obesity complicating pregnancy, third trimester: Secondary | ICD-10-CM

## 2022-03-19 DIAGNOSIS — R638 Other symptoms and signs concerning food and fluid intake: Secondary | ICD-10-CM

## 2022-03-19 NOTE — Procedures (Signed)
Susan Sellers 1991/01/21 [redacted]w[redacted]d  Fetus A Non-Stress Test Interpretation for 03/19/22  Indication: Chronic Hypertenstion and Unsatisfactory BPP  Fetal Heart Rate A Mode: External Baseline Rate (A): 145 bpm Variability: Moderate Accelerations: 15 x 15 Decelerations: None Multiple birth?: No  Uterine Activity Mode: Toco Contraction Frequency (min): none Resting Tone Palpated: Relaxed  Interpretation (Fetal Testing) Nonstress Test Interpretation: Reactive Overall Impression: Reassuring for gestational age Comments: tracing reviewed by Dr. Annamaria Boots

## 2022-03-25 ENCOUNTER — Encounter: Payer: Self-pay | Admitting: Cardiology

## 2022-03-25 ENCOUNTER — Ambulatory Visit: Payer: BC Managed Care – PPO | Attending: Cardiology | Admitting: Cardiology

## 2022-03-25 VITALS — BP 120/74 | HR 104 | Ht 68.0 in | Wt 284.0 lb

## 2022-03-25 DIAGNOSIS — O10919 Unspecified pre-existing hypertension complicating pregnancy, unspecified trimester: Secondary | ICD-10-CM | POA: Diagnosis not present

## 2022-03-25 NOTE — Progress Notes (Signed)
Cardio-Obstetrics Clinic  Follow Up Note   Date:  03/25/2022   ID:  Susan Sellers, DOB 26-Dec-1990, MRN [id number]  PCP:  Patient, No Pcp Per   [facility] Providers Cardiologist:  Thomasene Ripple, DO  Electrophysiologist:  None        Referring MD: No ref. provider found   Chief Complaint: " I am   History of Present Illness:    Susan Sellers is a 31 y.o. female [G4P0030] who returns for follow up of hypertension here today for follow-up visit.  Her last visit was January 01, 2022 at that time she was doing well from a cardiovascular standpoint.  Her blood pressure within normal.  I continued the patient on her aspirin.  She is here today for follow-up.  She has been checking her blood pressure and has not been high.   Prior CV Studies Reviewed: The following studies were reviewed today: None today  Past Medical History:  Diagnosis Date   Hypertension     Past Surgical History:  Procedure Laterality Date   CERVICAL CERCLAGE N/A 11/24/2021   Procedure: CERCLAGE CERVICAL;  Surgeon: Catalina Antigua, MD;  Location: [facility];  Service: Gynecology;  Laterality: N/A;   CERVICAL CERCLAGE N/A 12/12/2021   Procedure: CERCLAGE CERVICAL;  Surgeon: Noralee Space, MD;  Location: [facility];  Service: Gynecology;  Laterality: N/A;   WISDOM TOOTH EXTRACTION        OB History     Gravida  4   Para  0   Term  0   Preterm  0   AB  3   Living  0      SAB  2   IAB  0   Ectopic  0   Multiple  0   Live Births  0               Current Medications: Current Meds  Medication Sig   acetaminophen (TYLENOL) 325 MG tablet Take 2 tablets (650 mg total) by mouth every 4 (four) hours as needed (for pain scale < 4  OR  temperature  >/=  100.5 F).   aspirin EC 81 MG tablet Take 1 tablet (81 mg total) by mouth daily. Take after 12 weeks for prevention of preeclampsia later in pregnancy (Patient taking differently: Take 81 mg by mouth daily at 12 noon. Take after 12  weeks for prevention of preeclampsia later in pregnancy)   Elastic Bandages & Supports (COMFORT FIT MATERNITY SUPP MED) MISC Wear daily when ambulating   Ondansetron HCl (ZOFRAN PO) Take by mouth.   Prenatal Vit-Fe Fumarate-FA (MULTIVITAMIN-PRENATAL) 27-0.8 MG TABS tablet Take 1 tablet by mouth daily at 12 noon.   progesterone (PROMETRIUM) 200 MG capsule Place one capsule vaginally at bedtime   promethazine (PHENERGAN) 12.5 MG tablet Take 12.5 mg by mouth every 6 (six) hours as needed for nausea or vomiting.     Allergies:   Patient has no known allergies.   Social History   Socioeconomic History   Marital status: Single    Spouse name: Not on file   Number of children: Not on file   Years of education: Not on file   Highest education level: Not on file  Occupational History   Not on file  Tobacco Use   Smoking status: Never   Smokeless tobacco: Never  Vaping Use   Vaping Use: Never used  Substance and Sexual Activity   Alcohol use: Not Currently    Alcohol/week: 0.0 standard drinks  of alcohol   Drug use: Not Currently    Types: Marijuana    Comment: last used Nov 2022   Sexual activity: Not Currently    Partners: Male    Birth control/protection: None  Other Topics Concern   Not on file  Social History Narrative   Not on file   Social Determinants of Health   Financial Resource Strain: Not on file  Food Insecurity: No Food Insecurity (10/17/2021)   Hunger Vital Sign    Worried About Running Out of Food in the Last Year: Never true    Ran Out of Food in the Last Year: Never true  Transportation Needs: No Transportation Needs (10/17/2021)   PRAPARE - Administrator, Civil Service (Medical): No    Lack of Transportation (Non-Medical): No  Physical Activity: Not on file  Stress: Not on file  Social Connections: Not on file      Family History  Problem Relation Age of Onset   Heart disease Mother    Hypertension Mother    Hypertension Maternal  Grandmother    Hyperlipidemia Paternal Grandmother    Kidney failure Paternal Grandmother       ROS:   Please see the history of present illness.    None All other systems reviewed and are negative.   Labs/EKG Reviewed:    EKG:   EKG is was not ordered today.    Recent Labs: 09/30/2021: ALT 9; BUN 5; Creatinine, Ser 0.62; Potassium 3.6; Sodium 135; TSH 0.681 01/21/2022: Hemoglobin 9.7; Platelets 295   Recent Lipid Panel No results found for: "CHOL", "TRIG", "HDL", "CHOLHDL", "LDLCALC", "LDLDIRECT"  Physical Exam:    VS:  BP 120/74   Pulse (!) 104   Ht 5\' 8"  (1.727 m)   Wt 284 lb (128.8 kg)   LMP 07/10/2021 (Exact Date)   SpO2 99%   BMI 43.18 kg/m     Wt Readings from Last 3 Encounters:  03/25/22 284 lb (128.8 kg)  03/08/22 290 lb 3.2 oz (131.6 kg)  02/19/22 272 lb (123.4 kg)     GEN:  Well nourished, well developed in no acute distress HEENT: Normal NECK: No JVD; No carotid bruits LYMPHATICS: No lymphadenopathy CARDIAC: RRR, no murmurs, rubs, gallops RESPIRATORY:  Clear to auscultation without rales, wheezing or rhonchi  ABDOMEN: Soft, non-tender, non-distended MUSCULOSKELETAL:  No edema; No deformity  SKIN: Warm and dry NEUROLOGIC:  Alert and oriented x 3 PSYCHIATRIC:  Normal affect    Risk Assessment/Risk Calculators:     CARPREG II Risk Prediction Index Score:  1.  The patient's risk for a primary cardiac event is 5%.            ASSESSMENT & PLAN:    Chronic hypertension pregnancy Obesity  She is looking forward hopefully delivering soon. Blood pressure is within normal.  Postpartum follow-up will be 12 weeks virtually.   Patient Instructions  Medication Instructions:  Your physician recommends that you continue on your current medications as directed. Please refer to the Current Medication list given to you today.  *If you need a refill on your cardiac medications before your next appointment, please call your pharmacy*   Lab  Work: None ordered   If you have labs (blood work) drawn today and your tests are completely normal, you will receive your results only by: MyChart Message (if you have MyChart) OR A paper copy in the mail If you have any lab test that is abnormal or we need to change your  treatment, we will call you to review the results.   Testing/Procedures: None ordered    Follow-Up: At [facility], you and your health needs are our priority.  As part of our continuing mission to provide you with exceptional heart care, we have created designated Provider Care Teams.  These Care Teams include your primary Cardiologist (physician) and Advanced Practice Providers (APPs -  Physician Assistants and Nurse Practitioners) who all work together to provide you with the care you need, when you need it.  We recommend signing up for the patient portal called "MyChart".  Sign up information is provided on this After Visit Summary.  MyChart is used to connect with patients for Virtual Visits (Telemedicine).  Patients are able to view lab/test results, encounter notes, upcoming appointments, etc.  Non-urgent messages can be sent to your provider as well.   To learn more about what you can do with MyChart, go to [email or web address].    Your next appointment:   12 week(s)  The format for your next appointment:   Virtual Visit   Provider:   Thomasene Ripple, DO     Other Instructions   Important Information About Sugar         Dispo:  No follow-ups on file.   Medication Adjustments/Labs and Tests Ordered: Current medicines are reviewed at length with the patient today.  Concerns regarding medicines are outlined above.  Tests Ordered: No orders of the defined types were placed in this encounter.  Medication Changes: No orders of the defined types were placed in this encounter.

## 2022-03-25 NOTE — Patient Instructions (Signed)
Medication Instructions:  Your physician recommends that you continue on your current medications as directed. Please refer to the Current Medication list given to you today.  *If you need a refill on your cardiac medications before your next appointment, please call your pharmacy*   Lab Work: None ordered   If you have labs (blood work) drawn today and your tests are completely normal, you will receive your results only by: MyChart Message (if you have MyChart) OR A paper copy in the mail If you have any lab test that is abnormal or we need to change your treatment, we will call you to review the results.   Testing/Procedures: None ordered    Follow-Up: At [facility], you and your health needs are our priority.  As part of our continuing mission to provide you with exceptional heart care, we have created designated Provider Care Teams.  These Care Teams include your primary Cardiologist (physician) and Advanced Practice Providers (APPs -  Physician Assistants and Nurse Practitioners) who all work together to provide you with the care you need, when you need it.  We recommend signing up for the patient portal called "MyChart".  Sign up information is provided on this After Visit Summary.  MyChart is used to connect with patients for Virtual Visits (Telemedicine).  Patients are able to view lab/test results, encounter notes, upcoming appointments, etc.  Non-urgent messages can be sent to your provider as well.   To learn more about what you can do with MyChart, go to [email or web address].    Your next appointment:   12 week(s)  The format for your next appointment:   Virtual Visit   Provider:   Thomasene Ripple, DO     Other Instructions   Important Information About Sugar

## 2022-03-26 ENCOUNTER — Ambulatory Visit: Payer: BC Managed Care – PPO | Attending: Cardiology

## 2022-03-26 ENCOUNTER — Ambulatory Visit: Payer: BC Managed Care – PPO | Admitting: *Deleted

## 2022-03-26 VITALS — BP 120/76 | HR 94

## 2022-03-26 DIAGNOSIS — O2623 Pregnancy care for patient with recurrent pregnancy loss, third trimester: Secondary | ICD-10-CM | POA: Diagnosis not present

## 2022-03-26 DIAGNOSIS — O0993 Supervision of high risk pregnancy, unspecified, third trimester: Secondary | ICD-10-CM | POA: Insufficient documentation

## 2022-03-26 DIAGNOSIS — O3433 Maternal care for cervical incompetence, third trimester: Secondary | ICD-10-CM | POA: Diagnosis not present

## 2022-03-26 DIAGNOSIS — O09293 Supervision of pregnancy with other poor reproductive or obstetric history, third trimester: Secondary | ICD-10-CM | POA: Diagnosis not present

## 2022-03-26 DIAGNOSIS — D563 Thalassemia minor: Secondary | ICD-10-CM | POA: Diagnosis not present

## 2022-03-26 DIAGNOSIS — E669 Obesity, unspecified: Secondary | ICD-10-CM

## 2022-03-26 DIAGNOSIS — O10013 Pre-existing essential hypertension complicating pregnancy, third trimester: Secondary | ICD-10-CM

## 2022-03-26 DIAGNOSIS — O10919 Unspecified pre-existing hypertension complicating pregnancy, unspecified trimester: Secondary | ICD-10-CM | POA: Diagnosis not present

## 2022-03-26 DIAGNOSIS — Z3A37 37 weeks gestation of pregnancy: Secondary | ICD-10-CM

## 2022-03-26 DIAGNOSIS — O99213 Obesity complicating pregnancy, third trimester: Secondary | ICD-10-CM

## 2022-03-26 DIAGNOSIS — Z148 Genetic carrier of other disease: Secondary | ICD-10-CM

## 2022-03-29 ENCOUNTER — Other Ambulatory Visit (HOSPITAL_COMMUNITY)
Admission: RE | Admit: 2022-03-29 | Discharge: 2022-03-29 | Disposition: A | Discharge status: Home / Self Care | Payer: BC Managed Care – PPO | Source: Ambulatory Visit | Attending: Obstetrics and Gynecology | Admitting: Obstetrics and Gynecology

## 2022-03-29 ENCOUNTER — Ambulatory Visit (INDEPENDENT_AMBULATORY_CARE_PROVIDER_SITE_OTHER): Payer: BC Managed Care – PPO | Admitting: Obstetrics and Gynecology

## 2022-03-29 ENCOUNTER — Encounter: Payer: Self-pay | Admitting: Obstetrics and Gynecology

## 2022-03-29 VITALS — BP 112/75 | HR 101 | Wt 283.0 lb

## 2022-03-29 DIAGNOSIS — O0993 Supervision of high risk pregnancy, unspecified, third trimester: Secondary | ICD-10-CM

## 2022-03-29 DIAGNOSIS — O3432 Maternal care for cervical incompetence, second trimester: Secondary | ICD-10-CM

## 2022-03-29 DIAGNOSIS — O99213 Obesity complicating pregnancy, third trimester: Secondary | ICD-10-CM

## 2022-03-29 DIAGNOSIS — O10919 Unspecified pre-existing hypertension complicating pregnancy, unspecified trimester: Secondary | ICD-10-CM

## 2022-03-29 DIAGNOSIS — O3433 Maternal care for cervical incompetence, third trimester: Secondary | ICD-10-CM

## 2022-03-29 DIAGNOSIS — Z3A37 37 weeks gestation of pregnancy: Secondary | ICD-10-CM

## 2022-03-29 DIAGNOSIS — O10913 Unspecified pre-existing hypertension complicating pregnancy, third trimester: Secondary | ICD-10-CM

## 2022-03-29 NOTE — Progress Notes (Signed)
   PRENATAL VISIT NOTE  Subjective:  Susan Sellers is a 31 y.o. G4P0030 at [redacted]w[redacted]d being seen today for ongoing prenatal care.  She is currently monitored for the following issues for this high-risk pregnancy and has BMI 50.0-59.9, adult--morbid obesity; History of preterm premature rupture of membranes (PPROM) in previous pregnancy at 18 weeks, currently pregnant; Hypertension; Supervision of high-risk pregnancy; Maternal morbid obesity, antepartum (H[facility]); Chronic hypertension affecting pregnancy; Abnormal cervical Papanicolaou smear on 02/02/2021, normal colposcopy; Alpha thalassemia silent carrier; Incompetent cervix in pregnancy, antepartum, second trimester; and Cervical cerclage suture present on their problem list.  Patient reports no complaints.  Contractions: Not present. Vag. Bleeding: None.  Movement: Present. Denies leaking of fluid.   The following portions of the patient's history were reviewed and updated as appropriate: allergies, current medications, past family history, past medical history, past social history, past surgical history and problem list.   Objective:   Vitals:   03/29/22 1548  BP: 112/75  Pulse: (!) 101  Weight: 283 lb (128.4 kg)    Fetal Status: Fetal Heart Rate (bpm): 155 Fundal Height: 37 cm Movement: Present     General:  Alert, oriented and cooperative. Patient is in no acute distress.  Skin: Skin is warm and dry. No rash noted.   Cardiovascular: Normal heart rate noted  Respiratory: Normal respiratory effort, no problems with respiration noted  Abdomen: Soft, gravid, appropriate for gestational age.  Pain/Pressure: Present     Pelvic: Cervical exam performed in the presence of a chaperone Dilation: Closed Effacement (%): Thick Station: Ballotable  Extremities: Normal range of motion.     Mental Status: Normal mood and affect. Normal behavior. Normal judgment and thought content.   Assessment and Plan:  Pregnancy: G4P0030 at [redacted]w[redacted]d 1. Supervision of  high risk pregnancy in third trimester Patient is doing well without complaints Cultures today - CBC; Standing - Type and screen; Standing - RPR; Standing  2. Incompetent cervix in pregnancy, antepartum, second trimester Cerclage in situ removed without complications  3. Chronic hypertension affecting pregnancy Stable Continue ASA Will schedule IOL at 39 weeks  4. Maternal morbid obesity, antepartum (HCC)   Term labor symptoms and general obstetric precautions including but not limited to vaginal bleeding, contractions, leaking of fluid and fetal movement were reviewed in detail with the patient. Please refer to After Visit Summary for other counseling recommendations.   Return in about 1 week (around 04/05/2022) for in person, ROB, High risk.  Future Appointments  Date Time Provider Department Center  04/02/2022  3:30 PM [facility] NURSE [facility]  04/02/2022  3:45 PM [facility] US1 [facility]  04/05/2022  3:30 PM Leftwich-Kirby, Wilmer Floor, CNM [facility] None  04/12/2022  3:50 PM Aleta Manternach, Gigi Gin, MD [facility] None  04/19/2022  3:30 PM Milah Recht, Gigi Gin, MD [facility] None  06/30/2022  9:00 AM Tobb, Lavona [place]ound, DO [facility] None    Catalina Antigua, MD

## 2022-03-31 ENCOUNTER — Other Ambulatory Visit: Payer: Self-pay | Admitting: Advanced Practice Midwife

## 2022-03-31 LAB — CERVICOVAGINAL ANCILLARY ONLY
Chlamydia: NEGATIVE
Comment: NEGATIVE
Comment: NORMAL
Neisseria Gonorrhea: NEGATIVE

## 2022-04-02 ENCOUNTER — Ambulatory Visit: Payer: BC Managed Care – PPO | Attending: Obstetrics

## 2022-04-02 ENCOUNTER — Encounter: Payer: Self-pay | Admitting: *Deleted

## 2022-04-02 ENCOUNTER — Ambulatory Visit: Payer: BC Managed Care – PPO | Admitting: *Deleted

## 2022-04-02 ENCOUNTER — Encounter (HOSPITAL_COMMUNITY): Payer: Self-pay | Admitting: *Deleted

## 2022-04-02 ENCOUNTER — Telehealth (HOSPITAL_COMMUNITY): Payer: Self-pay | Admitting: *Deleted

## 2022-04-02 VITALS — BP 123/73 | HR 108

## 2022-04-02 DIAGNOSIS — O3433 Maternal care for cervical incompetence, third trimester: Secondary | ICD-10-CM

## 2022-04-02 DIAGNOSIS — E669 Obesity, unspecified: Secondary | ICD-10-CM | POA: Diagnosis not present

## 2022-04-02 DIAGNOSIS — O10913 Unspecified pre-existing hypertension complicating pregnancy, third trimester: Secondary | ICD-10-CM

## 2022-04-02 DIAGNOSIS — O09293 Supervision of pregnancy with other poor reproductive or obstetric history, third trimester: Secondary | ICD-10-CM

## 2022-04-02 DIAGNOSIS — O10013 Pre-existing essential hypertension complicating pregnancy, third trimester: Secondary | ICD-10-CM | POA: Diagnosis not present

## 2022-04-02 DIAGNOSIS — D563 Thalassemia minor: Secondary | ICD-10-CM | POA: Diagnosis not present

## 2022-04-02 DIAGNOSIS — R638 Other symptoms and signs concerning food and fluid intake: Secondary | ICD-10-CM

## 2022-04-02 DIAGNOSIS — Z3A38 38 weeks gestation of pregnancy: Secondary | ICD-10-CM

## 2022-04-02 DIAGNOSIS — Z148 Genetic carrier of other disease: Secondary | ICD-10-CM

## 2022-04-02 DIAGNOSIS — O99891 Other specified diseases and conditions complicating pregnancy: Secondary | ICD-10-CM

## 2022-04-02 DIAGNOSIS — O99213 Obesity complicating pregnancy, third trimester: Secondary | ICD-10-CM | POA: Diagnosis not present

## 2022-04-02 DIAGNOSIS — O2623 Pregnancy care for patient with recurrent pregnancy loss, third trimester: Secondary | ICD-10-CM

## 2022-04-02 LAB — CULTURE, BETA STREP (GROUP B ONLY): Strep Gp B Culture: POSITIVE — AB

## 2022-04-02 NOTE — Telephone Encounter (Signed)
Preadmission screen  

## 2022-04-05 ENCOUNTER — Ambulatory Visit (INDEPENDENT_AMBULATORY_CARE_PROVIDER_SITE_OTHER): Payer: BC Managed Care – PPO | Admitting: Advanced Practice Midwife

## 2022-04-05 ENCOUNTER — Encounter: Payer: Self-pay | Admitting: Advanced Practice Midwife

## 2022-04-05 VITALS — BP 116/73 | HR 92 | Wt 283.3 lb

## 2022-04-05 DIAGNOSIS — O10919 Unspecified pre-existing hypertension complicating pregnancy, unspecified trimester: Secondary | ICD-10-CM

## 2022-04-05 DIAGNOSIS — O3432 Maternal care for cervical incompetence, second trimester: Secondary | ICD-10-CM

## 2022-04-05 DIAGNOSIS — O0993 Supervision of high risk pregnancy, unspecified, third trimester: Secondary | ICD-10-CM

## 2022-04-05 DIAGNOSIS — Z3A38 38 weeks gestation of pregnancy: Secondary | ICD-10-CM

## 2022-04-05 DIAGNOSIS — O10913 Unspecified pre-existing hypertension complicating pregnancy, third trimester: Secondary | ICD-10-CM

## 2022-04-05 NOTE — Progress Notes (Signed)
   PRENATAL VISIT NOTE  Subjective:  Susan Sellers is a 31 y.o. G4P0030 at [redacted]w[redacted]d being seen today for ongoing prenatal care.  She is currently monitored for the following issues for this high-risk pregnancy and has BMI 50.0-59.9, adult--morbid obesity; History of preterm premature rupture of membranes (PPROM) in previous pregnancy at 18 weeks, currently pregnant; Hypertension; Supervision of high-risk pregnancy; Maternal morbid obesity, antepartum (HCC); Chronic hypertension affecting pregnancy; Abnormal cervical Papanicolaou smear on 02/02/2021, normal colposcopy; Alpha thalassemia silent carrier; Incompetent cervix in pregnancy, antepartum, second trimester; and Cervical cerclage suture present on their problem list.  Patient reports no complaints.  Contractions: Not present. Vag. Bleeding: None.  Movement: Present. Denies leaking of fluid.   The following portions of the patient's history were reviewed and updated as appropriate: allergies, current medications, past family history, past medical history, past social history, past surgical history and problem list.   Objective:   Vitals:   04/05/22 1526  BP: 116/73  Pulse: 92  Weight: 283 lb 4.8 oz (128.5 kg)    Fetal Status: Fetal Heart Rate (bpm): 150 Fundal Height: 38 cm Movement: Present     General:  Alert, oriented and cooperative. Patient is in no acute distress.  Skin: Skin is warm and dry. No rash noted.   Cardiovascular: Normal heart rate noted  Respiratory: Normal respiratory effort, no problems with respiration noted  Abdomen: Soft, gravid, appropriate for gestational age.  Pain/Pressure: Present     Pelvic: Cervical exam deferred        Extremities: Normal range of motion.  Edema: None  Mental Status: Normal mood and affect. Normal behavior. Normal judgment and thought content.   Assessment and Plan:  Pregnancy: G4P0030 at [redacted]w[redacted]d 1. Supervision of high risk pregnancy in third trimester --IOL scheduled on  04/09/22 --Labor signs/warning signs today  2. Chronic hypertension affecting pregnancy --BP wnl without medications  3. Incompetent cervix in pregnancy, antepartum, second trimester --cerclage removed at previous visit by Dr Jolayne Panther without difficulty  4. [redacted] weeks gestation of pregnancy   Term labor symptoms and general obstetric precautions including but not limited to vaginal bleeding, contractions, leaking of fluid and fetal movement were reviewed in detail with the patient. Please refer to After Visit Summary for other counseling recommendations.   No follow-ups on file.  Future Appointments  Date Time Provider Department Center  04/09/2022  6:45 AM [facility] SCHED ROOM [facility] None  06/30/2022  9:00 AM Tobb, Lavona Mound, DO [facility] None    Sharen Counter, CNM

## 2022-04-05 NOTE — Progress Notes (Signed)
Pt presents for ROB visit. No concerns at this time.  

## 2022-04-09 ENCOUNTER — Encounter (HOSPITAL_COMMUNITY): Payer: Self-pay | Admitting: Obstetrics and Gynecology

## 2022-04-09 ENCOUNTER — Inpatient Hospital Stay (HOSPITAL_COMMUNITY)
Admission: RE | Admit: 2022-04-09 | Discharge: 2022-04-11 | DRG: 807 | Disposition: A | Discharge status: Home / Self Care | Payer: BC Managed Care – PPO | Attending: Family Medicine | Admitting: Family Medicine

## 2022-04-09 ENCOUNTER — Encounter (HOSPITAL_COMMUNITY): Payer: Self-pay

## 2022-04-09 ENCOUNTER — Inpatient Hospital Stay (HOSPITAL_COMMUNITY): Admission: RE | Admit: 2022-04-09 | Payer: BC Managed Care – PPO | Source: Ambulatory Visit

## 2022-04-09 ENCOUNTER — Other Ambulatory Visit: Payer: Self-pay

## 2022-04-09 DIAGNOSIS — B951 Streptococcus, group B, as the cause of diseases classified elsewhere: Secondary | ICD-10-CM | POA: Diagnosis present

## 2022-04-09 DIAGNOSIS — Z148 Genetic carrier of other disease: Secondary | ICD-10-CM | POA: Diagnosis not present

## 2022-04-09 DIAGNOSIS — O1002 Pre-existing essential hypertension complicating childbirth: Principal | ICD-10-CM | POA: Diagnosis present

## 2022-04-09 DIAGNOSIS — O99214 Obesity complicating childbirth: Secondary | ICD-10-CM | POA: Diagnosis not present

## 2022-04-09 DIAGNOSIS — O3432 Maternal care for cervical incompetence, second trimester: Secondary | ICD-10-CM | POA: Diagnosis present

## 2022-04-09 DIAGNOSIS — I10 Essential (primary) hypertension: Secondary | ICD-10-CM | POA: Diagnosis present

## 2022-04-09 DIAGNOSIS — D563 Thalassemia minor: Secondary | ICD-10-CM | POA: Diagnosis present

## 2022-04-09 DIAGNOSIS — O99824 Streptococcus B carrier state complicating childbirth: Secondary | ICD-10-CM | POA: Diagnosis not present

## 2022-04-09 DIAGNOSIS — Z3A39 39 weeks gestation of pregnancy: Secondary | ICD-10-CM

## 2022-04-09 DIAGNOSIS — O1092 Unspecified pre-existing hypertension complicating childbirth: Secondary | ICD-10-CM | POA: Diagnosis not present

## 2022-04-09 DIAGNOSIS — Z3490 Encounter for supervision of normal pregnancy, unspecified, unspecified trimester: Secondary | ICD-10-CM | POA: Diagnosis present

## 2022-04-09 DIAGNOSIS — O0993 Supervision of high risk pregnancy, unspecified, third trimester: Secondary | ICD-10-CM

## 2022-04-09 LAB — CBC
HCT: 29.7 % — ABNORMAL LOW (ref 36.0–46.0)
HCT: 30.2 % — ABNORMAL LOW (ref 36.0–46.0)
Hemoglobin: 8.8 g/dL — ABNORMAL LOW (ref 12.0–15.0)
Hemoglobin: 9.5 g/dL — ABNORMAL LOW (ref 12.0–15.0)
MCH: 24.8 pg — ABNORMAL LOW (ref 26.0–34.0)
MCH: 25 pg — ABNORMAL LOW (ref 26.0–34.0)
MCHC: 29.6 g/dL — ABNORMAL LOW (ref 30.0–36.0)
MCHC: 31.5 g/dL (ref 30.0–36.0)
MCV: 83.7 fL (ref 80.0–100.0)
MCV: 79.5 fL — ABNORMAL LOW (ref 80.0–100.0)
Platelets: 301 10*3/uL (ref 150–400)
Platelets: 322 10*3/uL (ref 150–400)
RBC: 3.55 MIL/uL — ABNORMAL LOW (ref 3.87–5.11)
RBC: 3.8 MIL/uL — ABNORMAL LOW (ref 3.87–5.11)
RDW: 15.1 % (ref 11.5–15.5)
RDW: 15.1 % (ref 11.5–15.5)
WBC: 12.7 10*3/uL — ABNORMAL HIGH (ref 4.0–10.5)
WBC: 14.9 10*3/uL — ABNORMAL HIGH (ref 4.0–10.5)
nRBC: 0 % (ref 0.0–0.2)
nRBC: 0 % (ref 0.0–0.2)

## 2022-04-09 LAB — RPR: RPR Ser Ql: NONREACTIVE

## 2022-04-09 LAB — TYPE AND SCREEN
ABO/RH(D): B POS
Antibody Screen: NEGATIVE

## 2022-04-09 MED ORDER — MISOPROSTOL 50MCG HALF TABLET
50.0000 ug | ORAL_TABLET | Freq: Once | ORAL | Status: AC
  Administered 2022-04-09: 50 ug via ORAL
  Filled 2022-04-09: qty 1

## 2022-04-09 MED ORDER — OXYTOCIN-SODIUM CHLORIDE 30-0.9 UT/500ML-% IV SOLN: 2.5000 [IU]/h | INTRAVENOUS | Status: DC

## 2022-04-09 MED ORDER — EPHEDRINE 5 MG/ML INJ: 10.0000 mg | INTRAVENOUS | Status: DC | PRN

## 2022-04-09 MED ORDER — TERBUTALINE SULFATE 1 MG/ML IJ SOLN: 0.2500 mg | Freq: Once | INTRAMUSCULAR | Status: DC | PRN

## 2022-04-09 MED ORDER — ONDANSETRON HCL 4 MG/2ML IJ SOLN
4.0000 mg | Freq: Four times a day (QID) | INTRAMUSCULAR | Status: DC | PRN
  Administered 2022-04-09: 4 mg via INTRAVENOUS
  Filled 2022-04-09: qty 2

## 2022-04-09 MED ORDER — LACTATED RINGERS IV SOLN: 500.0000 mL | Freq: Once | INTRAVENOUS | Status: DC

## 2022-04-09 MED ORDER — PHENYLEPHRINE 80 MCG/ML (10ML) SYRINGE FOR IV PUSH (FOR BLOOD PRESSURE SUPPORT): 80.0000 ug | PREFILLED_SYRINGE | INTRAVENOUS | Status: DC | PRN

## 2022-04-09 MED ORDER — OXYTOCIN BOLUS FROM INFUSION
333.0000 mL | Freq: Once | INTRAVENOUS | Status: AC
  Administered 2022-04-10: 333 mL via INTRAVENOUS

## 2022-04-09 MED ORDER — OXYTOCIN-SODIUM CHLORIDE 30-0.9 UT/500ML-% IV SOLN
1.0000 m[IU]/min | INTRAVENOUS | Status: DC
  Administered 2022-04-09: 1 m[IU]/min via INTRAVENOUS
  Filled 2022-04-09: qty 500

## 2022-04-09 MED ORDER — SOD CITRATE-CITRIC ACID 500-334 MG/5ML PO SOLN: 30.0000 mL | ORAL | Status: DC | PRN

## 2022-04-09 MED ORDER — SODIUM CHLORIDE 0.9 % IV SOLN
5.0000 10*6.[IU] | Freq: Once | INTRAVENOUS | Status: AC
  Administered 2022-04-09: 5 10*6.[IU] via INTRAVENOUS
  Filled 2022-04-09: qty 5

## 2022-04-09 MED ORDER — OXYTOCIN-SODIUM CHLORIDE 30-0.9 UT/500ML-% IV SOLN: 1.0000 m[IU]/min | INTRAVENOUS | Status: DC

## 2022-04-09 MED ORDER — PENICILLIN G POT IN DEXTROSE 60000 UNIT/ML IV SOLN
3.0000 10*6.[IU] | INTRAVENOUS | Status: DC
  Administered 2022-04-09 (×2): 3 10*6.[IU] via INTRAVENOUS
  Filled 2022-04-09 (×4): qty 50

## 2022-04-09 MED ORDER — MISOPROSTOL 25 MCG QUARTER TABLET
25.0000 ug | ORAL_TABLET | Freq: Once | ORAL | Status: AC
  Administered 2022-04-09: 25 ug via VAGINAL
  Filled 2022-04-09: qty 1

## 2022-04-09 MED ORDER — LACTATED RINGERS IV SOLN: 500.0000 mL | INTRAVENOUS | Status: DC | PRN

## 2022-04-09 MED ORDER — OXYCODONE-ACETAMINOPHEN 5-325 MG PO TABS: 2.0000 | ORAL_TABLET | ORAL | Status: DC | PRN

## 2022-04-09 MED ORDER — OXYCODONE-ACETAMINOPHEN 5-325 MG PO TABS: 1.0000 | ORAL_TABLET | ORAL | Status: DC | PRN

## 2022-04-09 MED ORDER — LACTATED RINGERS IV SOLN: INTRAVENOUS | Status: DC

## 2022-04-09 MED ORDER — LIDOCAINE HCL (PF) 1 % IJ SOLN: 30.0000 mL | INTRAMUSCULAR | Status: DC | PRN

## 2022-04-09 MED ORDER — ACETAMINOPHEN 325 MG PO TABS: 650.0000 mg | ORAL_TABLET | ORAL | Status: DC | PRN

## 2022-04-09 MED ORDER — FENTANYL CITRATE (PF) 100 MCG/2ML IJ SOLN
100.0000 ug | INTRAMUSCULAR | Status: DC | PRN
  Administered 2022-04-09: 100 ug via INTRAVENOUS
  Filled 2022-04-09: qty 2

## 2022-04-09 MED ORDER — FENTANYL-BUPIVACAINE-NACL 0.5-0.125-0.9 MG/250ML-% EP SOLN: 12.0000 mL/h | EPIDURAL | Status: DC | PRN

## 2022-04-09 MED ORDER — DIPHENHYDRAMINE HCL 50 MG/ML IJ SOLN: 12.5000 mg | INTRAMUSCULAR | Status: DC | PRN

## 2022-04-09 NOTE — Progress Notes (Signed)
Patient ID: Susan Sellers, female   DOB: [birth year], 31 y.o.   MRN: [id number]  Coping well w ctx using nitrous  BPs 110/61, 113/74 FHR 125-130s, +accels, occ mi variables Ctx q 2-3 mins per pt> Pit @ 23mu/min Cx 9/C/0; AROM for clear fluid  IUP@39 .0wks cHTN Active labor/transition  Hopeful for vag delivery soon  Arabella Merles [facility] 04/09/2022 11:57 PM

## 2022-04-09 NOTE — Progress Notes (Signed)
Patient ID: Susan Sellers, female   DOB: August 16, 1990, 31 y.o.   MRN: [id number]  Feeling some cramping after cytotec but overall doing well; walked in the halls  BPs 130/84, 130/83 FHR 140-150, +accels, no decels Ctx irreg Cx 2/90/vtx -2; cervical foley inserted without difficulty  IUP@39 .0wks cHTN Cx becoming favorable  -Begin low dose 1x1 Pit and hold at 64mu/min until foley comes out, then begin titrating up to achieve active labor -Anticipate vag del  Arabella Merles CNM 04/09/2022 1:18 PM

## 2022-04-09 NOTE — H&P (Signed)
Susan Sellers is a 31 y.o. G51P0030 female at [redacted]w[redacted]d by LMP c/w 9wk u/s presenting for IOL due to cHTN (no meds required in pregnancy). Denies s/s pre-e. Reports active fetal movement, contractions: none, vaginal bleeding: none, membranes: intact.  Initiated prenatal care at [facility] at 11 wks.   Most recent u/s : [redacted]w[redacted]d, EFW 30%, AFI 13cm, post placenta, cephalic (then had BPP 8/8 at [redacted]w[redacted]d).   This pregnancy complicated by: # cHTN (no meds) # obesity # incompetent cx (hx 18wk PPROM) # GBS+  Prenatal History/Complications:  # early SAB x 2, then 18wk PPROM w retained products, D&E  Past Medical History: Past Medical History:  Diagnosis Date   Hypertension     Past Surgical History: Past Surgical History:  Procedure Laterality Date   CERVICAL CERCLAGE N/A 11/24/2021   Procedure: CERCLAGE CERVICAL;  Surgeon: Catalina Antigua, MD;  Location: [facility];  Service: Gynecology;  Laterality: N/A;   CERVICAL CERCLAGE N/A 12/12/2021   Procedure: CERCLAGE CERVICAL;  Surgeon: Noralee Space, MD;  Location: [facility];  Service: Gynecology;  Laterality: N/A;   WISDOM TOOTH EXTRACTION      Obstetrical History: OB History     Gravida  4   Para  0   Term  0   Preterm  0   AB  3   Living  0      SAB  2   IAB  0   Ectopic  0   Multiple  0   Live Births  0           Social History: Social History   Socioeconomic History   Marital status: Single    Spouse name: Not on file   Number of children: Not on file   Years of education: Not on file   Highest education level: Not on file  Occupational History   Not on file  Tobacco Use   Smoking status: Never   Smokeless tobacco: Never  Vaping Use   Vaping Use: Never used  Substance and Sexual Activity   Alcohol use: Not Currently    Alcohol/week: 0.0 standard drinks of alcohol   Drug use: Not Currently    Types: Marijuana    Comment: last used Nov 2022   Sexual activity: Not Currently    Partners: Male    Birth  control/protection: None  Other Topics Concern   Not on file  Social History Narrative   Not on file   Social Determinants of Health   Financial Resource Strain: Not on file  Food Insecurity: No Food Insecurity (04/09/2022)   Hunger Vital Sign    Worried About Running Out of Food in the Last Year: Never true    Ran Out of Food in the Last Year: Never true  Transportation Needs: No Transportation Needs (04/09/2022)   PRAPARE - Administrator, Civil Service (Medical): No    Lack of Transportation (Non-Medical): No  Physical Activity: Not on file  Stress: Not on file  Social Connections: Not on file    Family History: Family History  Problem Relation Age of Onset   Heart disease Mother    Hypertension Mother    Hypertension Maternal Grandmother    Hyperlipidemia Paternal Grandmother    Kidney failure Paternal Grandmother     Allergies: No Known Allergies  Medications Prior to Admission  Medication Sig Dispense Refill Last Dose   Prenatal Vit-Fe Fumarate-FA (MULTIVITAMIN-PRENATAL) 27-0.8 MG TABS tablet Take 1 tablet by mouth daily at 12  noon.   Past Week   acetaminophen (TYLENOL) 325 MG tablet Take 2 tablets (650 mg total) by mouth every 4 (four) hours as needed (for pain scale < 4  OR  temperature  >/=  100.5 F).      aspirin EC 81 MG tablet Take 1 tablet (81 mg total) by mouth daily. Take after 12 weeks for prevention of preeclampsia later in pregnancy (Patient taking differently: Take 81 mg by mouth daily at 12 noon. Take after 12 weeks for prevention of preeclampsia later in pregnancy) 300 tablet 2 More than a month   Elastic Bandages & Supports (COMFORT FIT MATERNITY SUPP MED) MISC Wear daily when ambulating 1 each 0     Review of Systems  Pertinent pos/neg as indicated in HPI  Blood pressure 119/77, pulse 90, temperature 98.1 F (36.7 C), temperature source Oral, resp. rate 16, height 5\' 8"  (1.727 m), weight 129.9 kg, last menstrual period 07/10/2021,  unknown if currently breastfeeding. General appearance: alert, cooperative, and no distress Lungs: clear to auscultation bilaterally Heart: regular rate and rhythm Abdomen: gravid, soft, non-tender, EFW by Leopold's approximately 6-7lbs Extremities: tr edema  Fetal monitoring: FHR: 140s bpm, variability: moderate,  Accelerations: Present,  decelerations:  Absent Uterine activity: irreg, mild Dilation: Fingertip Effacement (%): 30 Station: -3 Exam by:: [email or web address] RN Presentation: cephalic   Prenatal labs: ABO, Rh: --/--/B POS (11/24 0755) Antibody: NEG (11/24 0755) Rubella: 1.53 (05/17 1553) RPR: Non Reactive (09/07 0827)  HBsAg: Negative (05/17 1553)  HIV: Non Reactive (09/07 0827)  GBS: Positive/-- (11/13 1616)  2hr GTT: 81/135/111  Prenatal Transfer Tool  Maternal Diabetes: No Genetic Screening: Normal Maternal Ultrasounds/Referrals: Normal Fetal Ultrasounds or other Referrals:  Referred to Materal Fetal Medicine  Maternal Substance Abuse:  No Significant Maternal Medications:  None Significant Maternal Lab Results: Group B Strep positive  Results for orders placed or performed during the hospital encounter of 04/09/22 (from the past 24 hour(s))  Type and screen   Collection Time: 04/09/22  7:55 AM  Result Value Ref Range   ABO/RH(D) B POS    Antibody Screen NEG    Sample Expiration      04/12/2022,2359 Performed at [facility], [address], [place], [place]   CBC   Collection Time: 04/09/22  7:57 AM  Result Value Ref Range   WBC 12.7 (H) 4.0 - 10.5 K/uL   RBC 3.55 (L) 3.87 - 5.11 MIL/uL   Hemoglobin 8.8 (L) 12.0 - 15.0 g/dL   HCT 04/11/22 (L) 92.4 - 26.8 %   MCV 83.7 80.0 - 100.0 fL   MCH 24.8 (L) 26.0 - 34.0 pg   MCHC 29.6 (L) 30.0 - 36.0 g/dL   RDW 34.1 96.2 - 22.9 %   Platelets 301 150 - 400 K/uL   nRBC 0.0 0.0 - 0.2 %     Assessment:  [redacted]w[redacted]d SIUP  G4P0030  cHTN  Cat 1 FHR  GBS Positive/-- (11/13 1616)  Plan:  Admit to  L&D  IV pain meds/epidural prn active labor  Cytotec to start for cervical ripening (50/25); will proceed w additional cytotec/cervical foley followed by Pit/AROM prn  PCN when in labor/ROM for GBS ppx  Anticipate vag del   Plans to breastfeed  Contraception: declines   04-27-1975 CNM 04/09/2022, 10:19 AM

## 2022-04-09 NOTE — Progress Notes (Signed)
Patient ID: Susan Sellers, female   DOB: 1990/10/09, 31 y.o.   MRN: [id number]  S/p cervical foley and started increasing Pitocin at 1545; breathing thru ctx and doing well  BPs 96/52, 107/53 FHR 150s, +accels, no decels Ctx q 2-3 mins with Pit at 63mu/min Cx 6+/90/vtx -1 per RN exam at 1939  IUP@39 .0wks Active labor cHTN  Getting ready to receive Fentanyl/Zofran for pain and nausea; contemplating epidural; anticipate vag del  Arabella Merles [facility] 04/09/2022 8:09 PM

## 2022-04-10 ENCOUNTER — Encounter (HOSPITAL_COMMUNITY): Payer: Self-pay | Admitting: Obstetrics and Gynecology

## 2022-04-10 DIAGNOSIS — O99824 Streptococcus B carrier state complicating childbirth: Secondary | ICD-10-CM

## 2022-04-10 DIAGNOSIS — Z3A39 39 weeks gestation of pregnancy: Secondary | ICD-10-CM

## 2022-04-10 DIAGNOSIS — O1092 Unspecified pre-existing hypertension complicating childbirth: Secondary | ICD-10-CM

## 2022-04-10 MED ORDER — IBUPROFEN 600 MG PO TABS
600.0000 mg | ORAL_TABLET | Freq: Four times a day (QID) | ORAL | Status: DC
  Administered 2022-04-10 – 2022-04-11 (×5): 600 mg via ORAL
  Filled 2022-04-10 (×4): qty 1

## 2022-04-10 MED ORDER — BENZOCAINE-MENTHOL 20-0.5 % EX AERO: 1.0000 | INHALATION_SPRAY | CUTANEOUS | Status: DC | PRN

## 2022-04-10 MED ORDER — MEASLES, MUMPS & RUBELLA VAC IJ SOLR: 0.5000 mL | Freq: Once | INTRAMUSCULAR | Status: DC

## 2022-04-10 MED ORDER — COCONUT OIL OIL: 1.0000 | TOPICAL_OIL | Status: DC | PRN

## 2022-04-10 MED ORDER — SENNOSIDES-DOCUSATE SODIUM 8.6-50 MG PO TABS
2.0000 | ORAL_TABLET | ORAL | Status: DC
  Administered 2022-04-10 – 2022-04-11 (×2): 2 via ORAL
  Filled 2022-04-10 (×2): qty 2

## 2022-04-10 MED ORDER — ZOLPIDEM TARTRATE 5 MG PO TABS: 5.0000 mg | ORAL_TABLET | Freq: Every evening | ORAL | Status: DC | PRN

## 2022-04-10 MED ORDER — FUROSEMIDE 20 MG PO TABS
20.0000 mg | ORAL_TABLET | Freq: Every day | ORAL | Status: DC
  Administered 2022-04-10 – 2022-04-11 (×2): 20 mg via ORAL
  Filled 2022-04-10 (×2): qty 1

## 2022-04-10 MED ORDER — DIPHENHYDRAMINE HCL 25 MG PO CAPS: 25.0000 mg | ORAL_CAPSULE | Freq: Four times a day (QID) | ORAL | Status: DC | PRN

## 2022-04-10 MED ORDER — OXYCODONE HCL 5 MG PO TABS: 5.0000 mg | ORAL_TABLET | ORAL | Status: DC | PRN

## 2022-04-10 MED ORDER — ONDANSETRON HCL 4 MG PO TABS: 4.0000 mg | ORAL_TABLET | ORAL | Status: DC | PRN

## 2022-04-10 MED ORDER — ONDANSETRON HCL 4 MG/2ML IJ SOLN: 4.0000 mg | INTRAMUSCULAR | Status: DC | PRN

## 2022-04-10 MED ORDER — TETANUS-DIPHTH-ACELL PERTUSSIS 5-2.5-18.5 LF-MCG/0.5 IM SUSY
0.5000 mL | PREFILLED_SYRINGE | Freq: Once | INTRAMUSCULAR | Status: DC
  Filled 2022-04-10: qty 0.5

## 2022-04-10 MED ORDER — PRENATAL MULTIVITAMIN CH
1.0000 | ORAL_TABLET | Freq: Every day | ORAL | Status: DC
  Administered 2022-04-10 – 2022-04-11 (×2): 1 via ORAL
  Filled 2022-04-10 (×2): qty 1

## 2022-04-10 MED ORDER — SIMETHICONE 80 MG PO CHEW: 80.0000 mg | CHEWABLE_TABLET | ORAL | Status: DC | PRN

## 2022-04-10 MED ORDER — DIBUCAINE (PERIANAL) 1 % EX OINT: 1.0000 | TOPICAL_OINTMENT | CUTANEOUS | Status: DC | PRN

## 2022-04-10 MED ORDER — WITCH HAZEL-GLYCERIN EX PADS: 1.0000 | MEDICATED_PAD | CUTANEOUS | Status: DC | PRN

## 2022-04-10 MED ORDER — ACETAMINOPHEN 325 MG PO TABS: 650.0000 mg | ORAL_TABLET | ORAL | Status: DC | PRN

## 2022-04-10 NOTE — Lactation Note (Signed)
This note was copied from a baby's chart. Lactation Consultation Note  Patient Name: Susan Sellers [id number] Date: 04/10/2022 Reason for consult: Follow-up assessment;Difficult latch;Primapara;1st time breastfeeding;Term;Breastfeeding assistance Age:29 hours  LC entered the room and the birth parent was attempting to latch the infant.  Per the birth parent, she has been experiencing some soreness when breastfeeding.  LC assisted the birth parent with latching the infant to the right breast in the cross-cradle hold.  The infant latched with her tongue down, lips were flanged, sucking was rhythmic, and some swallows were noted.  The infant came off the breast and attempted to latch herself.  The birth parent stated that the latch was uncomfortable.  LC showed the birth parent how to break the latch and use the cross-cradle hold to control the infant's head.  The birth parent stated that the latch was much more comfortable.  All questions were answered.  The birth parent had no further concerns.  LC left her name on the board.  The birth parent will call RN/LC for assistance with breastfeeding.   Feeding Mother's Current Feeding Choice: Breast Milk  LATCH Score Latch: Grasps breast easily, tongue down, lips flanged, rhythmical sucking.  Audible Swallowing: Spontaneous and intermittent  Type of Nipple: Everted at rest and after stimulation  Comfort (Breast/Nipple): Filling, red/small blisters or bruises, mild/mod discomfort  Hold (Positioning): Assistance needed to correctly position infant at breast and maintain latch.  LATCH Score: 8   Lactation Tools Discussed/Used    Interventions Interventions: Breast feeding basics reviewed;Assisted with latch;Adjust position;Education  Discharge    Consult Status Consult Status: Follow-up Date: 04/11/22 Follow-up type: In-patient    Orvil Feil Parrie Rasco 04/10/2022, 3:01 PM

## 2022-04-10 NOTE — Lactation Note (Signed)
This note was copied from a baby's chart. Lactation Consultation Note  Patient Name: Susan Sellers [id number] Date: 04/10/2022 Reason for consult: Initial assessment;1st time breastfeeding;Term Age:29 hours, P1, infant has two bottom natal teeth. Birth Parent doesn't have breast pump at home would like hand pump and interested in applying for [employer], Stork pump form left in Birth Parent's room need Medical Provider signature. Per Birth Parent she has Probation officer and [employer].  Birth Parent latched infant on her right breast using the football hold position, infant latched with depth and was still breastfeeding after 15 minutes when LC left the room. Birth Parent feels infant has been latching well in L&D and on MBU. Birth Parent will continue to breastfeed infant according to hunger cues, on demand, 8 to 12+ times within 24 hours, STS. Birth Parent knows to call RN/LC if their are any BF questions, concerns or if further latch assistance is needed. LC discussed importance of maternal rest, diet and hydration with Birth Parent. Mom made aware of O/P services, breastfeeding support groups, community resources, and our phone # for post-discharge questions.    Maternal Data Does the patient have breastfeeding experience prior to this delivery?: No  Feeding Mother's Current Feeding Choice: Breast Milk  LATCH Score Latch: Grasps breast easily, tongue down, lips flanged, rhythmical sucking.  Audible Swallowing: Spontaneous and intermittent  Type of Nipple: Everted at rest and after stimulation  Comfort (Breast/Nipple): Soft / non-tender  Hold (Positioning): Assistance needed to correctly position infant at breast and maintain latch.  LATCH Score: 9   Lactation Tools Discussed/Used    Interventions Interventions: Breast feeding basics reviewed;Adjust position;Support pillows;Position options;Skin to skin;Assisted with latch;Breast compression;LC Services  brochure  Discharge    Consult Status Consult Status: Follow-up Date: 04/10/22 Follow-up type: In-patient    Frederico Hamman 04/10/2022, 3:30 AM

## 2022-04-10 NOTE — Discharge Summary (Signed)
   Postpartum Discharge Summary  Date of Service updated-yes     Patient Name: Susan Sellers DOB: 10/01/1990 MRN: [id number]  Date of admission: 04/09/2022 Delivery date:04/10/2022  Delivering provider: SHAW, KIMBERLY D  Date of discharge: 04/11/2022  Admitting diagnosis: Encounter for induction of labor [Z34.90] Intrauterine pregnancy: [redacted]w[redacted]d     Secondary diagnosis:  Principal Problem:   Encounter for induction of labor Active Problems:   Hypertension   Maternal morbid obesity, antepartum (HCC)   Alpha thalassemia silent carrier   Incompetent cervix in pregnancy, antepartum, second trimester   Group beta Strep positive  Additional problems: none    Discharge diagnosis: Term Pregnancy Delivered and CHTN                                              Post partum procedures: n/a Augmentation: AROM, Pitocin, Cytotec, and IP Foley Complications: None  Hospital course: Induction of Labor With Vaginal Delivery   31 y.o. yo G4P0030 at [redacted]w[redacted]d was admitted to the hospital 04/09/2022 for induction of labor.  Indication for induction:  cHTN .  Patient had an uncomplicated labor course, delivering approx 15hrs after her induction was started.  Membrane Rupture Time/Date: 11:52 PM ,04/09/2022   Delivery Method:Vaginal, Spontaneous  Episiotomy: None  Lacerations:  None  Details of delivery can be found in separate delivery note.  Patient had a postpartum course complicated by none. Patient is discharged home 04/11/22.  Newborn Data: Birth date:04/10/2022  Birth time:12:30 AM  Gender:Female  Living status:Living  Apgars:9 ,9  Weight:3095 g (6lb 13.2oz)  Magnesium Sulfate received: No BMZ received: No Rhophylac:N/A MMR:N/A T-DaP: declined prenatally Flu: No Transfusion:No  Physical exam  Vitals:   04/10/22 1350 04/10/22 1809 04/10/22 2011 04/11/22 0608  BP: 103/66 112/67 (!) 112/59 113/70  Pulse: 62 70 69 66  Resp: 18 18 17 17  Temp: 98.1 F (36.7 C) 98.1 F (36.7 C)  98.4 F (36.9 C) 97.9 F (36.6 C)  TempSrc: Oral Oral Oral Oral  SpO2:  100% 100% 100%  Weight:      Height:       General: alert, cooperative, and no distress Lochia: appropriate Uterine Fundus: firm Incision: N/A DVT Evaluation: No evidence of DVT seen on physical exam. Negative Homan's sign. No cords or calf tenderness. No significant calf/ankle edema. Labs: Lab Results  Component Value Date   WBC 14.9 (H) 04/09/2022   HGB 9.5 (L) 04/09/2022   HCT 30.2 (L) 04/09/2022   MCV 79.5 (L) 04/09/2022   PLT 322 04/09/2022      Latest Ref Rng & Units 09/30/2021    3:53 PM  CMP  Glucose 70 - 99 mg/dL 95   BUN 6 - 20 mg/dL 5   Creatinine 0.57 - 1.00 mg/dL 0.62   Sodium 134 - 144 mmol/L 135   Potassium 3.5 - 5.2 mmol/L 3.6   Chloride 96 - 106 mmol/L 99   CO2 20 - 29 mmol/L 22   Calcium 8.7 - 10.2 mg/dL 9.6   Total Protein 6.0 - 8.5 g/dL 7.0   Total Bilirubin 0.0 - 1.2 mg/dL 0.5   Alkaline Phos 44 - 121 IU/L 65   AST 0 - 40 IU/L 12   ALT 0 - 32 IU/L 9    Edinburgh Score:    04/10/2022    6:12 PM  Edinburgh Postnatal Depression Scale Screening Tool    I have been able to laugh and see the funny side of things. 0  I have looked forward with enjoyment to things. 0  I have blamed myself unnecessarily when things went wrong. 0  I have been anxious or worried for no good reason. 0  I have felt scared or panicky for no good reason. 1  Things have been getting on top of me. 0  I have been so unhappy that I have had difficulty sleeping. 0  I have felt sad or miserable. 0  I have been so unhappy that I have been crying. 0  The thought of harming myself has occurred to me. 0  Edinburgh Postnatal Depression Scale Total 1     After visit meds:  Allergies as of 04/11/2022   No Known Allergies      Medication List     TAKE these medications    furosemide 20 MG tablet Commonly known as: LASIX Take 1 tablet (20 mg total) by mouth daily.   ibuprofen 600 MG  tablet Commonly known as: ADVIL Take 1 tablet (600 mg total) by mouth every 6 (six) hours as needed for mild pain or cramping.   PRENATAL PO Take 1 tablet by mouth daily.         Discharge home in stable condition Infant Feeding: Breast Infant Disposition:home with mother Discharge instruction: per After Visit Summary and Postpartum booklet. Activity: Advance as tolerated. Pelvic rest for 6 weeks.  Diet: routine diet Future Appointments: Future Appointments  Date Time Provider Department Center  06/30/2022  9:00 AM Tobb, Kardie, DO [facility] None   Follow up Visit:  Shaw, Kimberly D, CNM  P [facility] Please schedule this patient for Postpartum visit in: 6 weeks with the following provider: Any provider In-Person For C/S patients schedule nurse incision check in weeks 2 weeks: no High risk pregnancy complicated by: cHTN Delivery mode:  SVD Anticipated Birth Control:  other/unsure PP Procedures needed: BP check w RN in 1wk Schedule Integrated BH visit: no   04/11/2022 Melanie Bhambri, CNM    

## 2022-04-10 NOTE — Lactation Note (Signed)
This note was copied from a baby's chart. Lactation Consultation Note  Patient Name: Girl Navil Kole [id number] Date: 04/10/2022   Age:29 hours  LC attempted to visit with the dyad, but the NT was in the room. Lactation will follow-up later.   Maternal Data    Feeding    LATCH Score                    Lactation Tools Discussed/Used    Interventions    Discharge    Consult Status      Delene Loll 04/10/2022, 2:21 PM

## 2022-04-11 MED ORDER — IBUPROFEN 600 MG PO TABS: 600.0000 mg | ORAL_TABLET | Freq: Four times a day (QID) | ORAL | 0 refills | Status: AC | PRN

## 2022-04-11 MED ORDER — FUROSEMIDE 20 MG PO TABS: 20.0000 mg | ORAL_TABLET | Freq: Every day | ORAL | 0 refills | Status: DC

## 2022-04-11 NOTE — Lactation Note (Signed)
This note was copied from a baby's chart. Lactation Consultation Note  Patient Name: Susan Sellers [id number] Date: 04/11/2022 Reason for consult: Follow-up assessment;Primapara;1st time breastfeeding;Term Age:29 hours  LC in to visit with P1 Mom of term baby exclusively breastfeeding.  Baby is currently at a 4% weight loss and has good output.    Mom requested a hand pump.  LC assembled hand pump and sized her with 24 mm flanges.  STORK pump requested emailed and LC called and left message twice.  Encouraged continues STS with baby.  Encouraged breastfeeding often with cues. Engorgement prevention and treatment reviewed.  Message sent to OP lactation consultant for F/U.  Mom denies any further questions.   Lactation Tools Discussed/Used Tools: Pump;Flanges Flange Size: 24 Breast pump type: Manual Pump Education: Setup, frequency, and cleaning Reason for Pumping: Mom's request Pumping frequency: PRN  Interventions Interventions: Breast feeding basics reviewed;Skin to skin;Breast massage;Hand pump  Discharge Discharge Education: Engorgement and breast care;Warning signs for feeding baby;Outpatient recommendation;Outpatient Epic message sent Pump: Stork Pump;Manual [facility] Program: No  Consult Status Consult Status: Complete Date: 04/11/22 Follow-up type: Out-patient    Susan Sellers 04/11/2022, 10:41 AM

## 2022-04-11 NOTE — Lactation Note (Signed)
This note was copied from a baby's chart. Lactation Consultation Note  Patient Name: Susan Sellers [id number] Date: 04/11/2022 Reason for consult: Follow-up assessment;Primapara;1st time breastfeeding;Term Age:29 hours  Mom is choosing to not wait on STORK pump.  LC called and cancelled order.  Mom will call her insurance company from home.   Judee Clara 04/11/2022, 10:59 AM

## 2022-04-12 ENCOUNTER — Encounter: Payer: BC Managed Care – PPO | Admitting: Obstetrics and Gynecology

## 2022-04-19 ENCOUNTER — Encounter: Payer: BC Managed Care – PPO | Admitting: Obstetrics and Gynecology

## 2022-04-19 ENCOUNTER — Ambulatory Visit (INDEPENDENT_AMBULATORY_CARE_PROVIDER_SITE_OTHER): Payer: BC Managed Care – PPO | Admitting: General Practice

## 2022-04-19 VITALS — BP 133/88 | HR 80 | Ht 68.5 in | Wt 276.7 lb

## 2022-04-19 DIAGNOSIS — I10 Essential (primary) hypertension: Secondary | ICD-10-CM

## 2022-04-19 MED ORDER — HYDROCHLOROTHIAZIDE 25 MG PO TABS: 25.0000 mg | ORAL_TABLET | Freq: Every day | ORAL | 0 refills | Status: AC

## 2022-04-19 MED ORDER — DOCUSATE SODIUM 100 MG PO CAPS: 100.0000 mg | ORAL_CAPSULE | Freq: Two times a day (BID) | ORAL | 2 refills | Status: DC | PRN

## 2022-04-19 NOTE — Progress Notes (Signed)
Subjective:  Susan Sellers is a 31 y.o. female here for BP check.   Hypertension ROS: no TIA's, no chest pain on exertion, no dyspnea on exertion, no swelling of ankles, does note some dizziness when arising, and no palpitations.    Objective:  Appearance alert, well appearing, and in no distress and oriented to person, place, and time. General exam BP noted to be well controlled today in office.    Assessment:   Blood Pressure borderline controlled.   Plan:  Pt sent 30 day Rx for Hydrochlorothiazide 25mg  and schedule to come back to office in 1 week for a blood pressure check per , MD .

## 2022-04-26 ENCOUNTER — Ambulatory Visit (INDEPENDENT_AMBULATORY_CARE_PROVIDER_SITE_OTHER): Payer: BC Managed Care – PPO

## 2022-04-26 VITALS — BP 121/80 | HR 61 | Ht 68.0 in | Wt 272.0 lb

## 2022-04-26 DIAGNOSIS — Z0130 Encounter for examination of blood pressure without abnormal findings: Secondary | ICD-10-CM

## 2022-04-26 NOTE — Progress Notes (Signed)
Subjective:  Susan Sellers is a 31 y.o. female here for BP check, 2 Weeks PP.   Hypertension ROS: taking medications as instructed, no medication side effects noted, no TIA's, no chest pain on exertion, no dyspnea on exertion, and no swelling of ankles.    Objective:  BP 121/80   Pulse 61   Ht 5\' 8"  (1.727 m)   Wt 272 lb (123.4 kg)   Breastfeeding Yes   BMI 41.36 kg/m   Appearance alert, well appearing, and in no distress. General exam BP noted to be well controlled today in office.    Assessment:   Blood Pressure well controlled.   Plan:  Current treatment plan is effective, no change in therapy. Keep PP appointment. Keep monitoring BP at home.

## 2022-04-26 NOTE — Progress Notes (Signed)
Patient was assessed and managed by nursing staff during this encounter. I have reviewed the chart and agree with the documentation and plan. I have also made any necessary editorial changes.  Catalina Antigua, MD 04/26/2022 1:11 PM

## 2022-05-16 ENCOUNTER — Other Ambulatory Visit: Payer: Self-pay | Admitting: Obstetrics and Gynecology

## 2022-06-01 ENCOUNTER — Other Ambulatory Visit (HOSPITAL_COMMUNITY)
Admission: RE | Admit: 2022-06-01 | Discharge: 2022-06-01 | Disposition: A | Discharge status: Home / Self Care | Payer: BC Managed Care – PPO | Source: Ambulatory Visit | Attending: Obstetrics and Gynecology | Admitting: Obstetrics and Gynecology

## 2022-06-01 ENCOUNTER — Encounter: Payer: Self-pay | Admitting: Obstetrics and Gynecology

## 2022-06-01 ENCOUNTER — Ambulatory Visit (INDEPENDENT_AMBULATORY_CARE_PROVIDER_SITE_OTHER): Payer: BC Managed Care – PPO | Admitting: Obstetrics and Gynecology

## 2022-06-01 DIAGNOSIS — R87619 Unspecified abnormal cytological findings in specimens from cervix uteri: Secondary | ICD-10-CM | POA: Insufficient documentation

## 2022-06-01 NOTE — Progress Notes (Signed)
Post Partum Visit Note  Susan Sellers is a 32 y.o. [phone] female who presents for a postpartum visit. She is 7 weeks postpartum following a normal spontaneous vaginal delivery.  I have fully reviewed the prenatal and intrapartum course. The delivery was at 39.1 gestational weeks.  Anesthesia: IV sedation. Postpartum course has been unremarkable. Baby is doing well. Baby is feeding by breast. Bleeding no bleeding. Bowel function is normal. Bladder function is normal. Patient is not sexually active. Contraception method is abstinence. Postpartum depression screening: negative.   Upstream - 06/01/22 1341       Pregnancy Intention Screening   Does the patient want to become pregnant in the next year? No    Does the patient's partner want to become pregnant in the next year? No    Would the patient like to discuss contraceptive options today? Yes      Contraception Wrap Up   Current Method No Contraceptive Precautions    End Method No Contraception Precautions    Contraception Counseling Provided Yes    How was the end contraceptive method provided? N/A            The pregnancy intention screening data noted above was reviewed. Potential methods of contraception were discussed. The patient elected to proceed with No Contraception Precautions.   Edinburgh Postnatal Depression Scale - 06/01/22 1340       Edinburgh Postnatal Depression Scale:  In the Past 7 Days   I have been able to laugh and see the funny side of things. 0    I have looked forward with enjoyment to things. 0    I have blamed myself unnecessarily when things went wrong. 0    I have been anxious or worried for no good reason. 0    I have felt scared or panicky for no good reason. 0    Things have been getting on top of me. 0    I have been so unhappy that I have had difficulty sleeping. 0    I have felt sad or miserable. 0    I have been so unhappy that I have been crying. 0    The thought of harming myself has  occurred to me. 0    Edinburgh Postnatal Depression Scale Total 0            Health Maintenance Due  Topic Date Due   COVID-19 Vaccine (1) Never done   DTaP/Tdap/Td (1 - Tdap) Never done   INFLUENZA VACCINE  Never done   The following portions of the patient's history were reviewed and updated as appropriate: allergies, current medications, past family history, past medical history, past social history, past surgical history, and problem list.  Review of Systems Pertinent items are noted in HPI.  Objective:  BP 130/88   Pulse 90   Ht 5' 8.5" (1.74 m)   Wt 264 lb (119.7 kg)   Breastfeeding Yes   BMI 39.56 kg/m    General:  alert and no distress   Breasts:  not indicated  Lungs: Normal respiratory effort  Heart:  Normal peripheral perfusion  Abdomen: Soft, nondistended nontender    Wound N/a  GU exam:  normal. Exam performed in presence of chaperone.       Assessment:   Normal postpartum exam.   Plan:   Essential components of care per ACOG recommendations:  1.  Mood and well being: Patient with negative depression screening today. Reviewed local resources for support.  - Patient  tobacco use? No.   - hx of drug use? No.    2. Infant care and feeding:  -Patient currently breastmilk feeding? Yes. Reviewed importance of draining breast regularly to support lactation.  -Social determinants of health (SDOH) reviewed in EPIC. No concerns  3. Sexuality, contraception and birth spacing - Patient does not want a pregnancy in the next year.  - Reviewed reproductive life planning. Reviewed contraceptive methods based on pt preferences and effectiveness.  Patient is undecided on final method, is not currently sexually active. - Bedsider website discussion - Discussed birth spacing of 18 months  4. Sleep and fatigue -Encouraged family/partner/community support of 4 hrs of uninterrupted sleep to help with mood and fatigue  5. Physical Recovery  - Discussed patients  delivery and complications. She describes her labor as good. - Patient had a Vaginal, no problems at delivery. - Patient has urinary incontinence? No. - Patient is safe to resume physical and sexual activity  6.  Health Maintenance - HM due items addressed Yes - Last pap smear No results found for: "DIAGPAP" Pap smear done at today's visit.  -Breast Cancer screening indicated? No.   7. Chronic Disease/Pregnancy Condition follow up: Hypertension - Continue HCTZ - PCP follow up  [place], [place], [facility] for [place]

## 2022-06-01 NOTE — Patient Instructions (Signed)
BEDSIDER.[facility] - birth control website  [place] [phone]) [place] [address] [place], [place] [address] [phone] Mon-Fri 8:30-12:30, 1:30-5:00 Accepting [facility] at [place], [place], [place] [address] [phone] Mon-Fri 8:00-5:30 [facility] [address] [place], [place] [address] [phone] Mechele Daw[place], Thur, Fri 8:30-5:00, Wed 10:00-7:00 (closed 1-2pm) Accepting [facility]. [address], [place], [place]  [address] Phone - [phone]   Fax - [phone]  [place] [phone]) [facility] [address] [place], [place] [address] [phone] Mon-Fri 8:00-5:00 [facility] Pediatric[facility] at [facility])  [address] Barbara Cower [place], [place] [address] [phone] Mon-Fri 8:30-5:30, Sat (Oct.-Mar.) 9:00-1:00 Accepting Medicaid  [place] [phone]) [place] at [place], [place], [place] [address] [phone] Mon-Fri 8:00-5:00  [place] [phone]) [place] at [facility] [address] [place], [place] [address] [phone] Mon-Fri 8:00-5:00 [place] at [place], [place], [place] [address] [phone] Mon-Fri 8:00-5:00 [place] at [facility] [address] Madelaine Bhat [place], [place] [address] [phone] Mon-Fri 8:00-5:00 [place]., [place] [address] [phone] Mon-Fri 7:30-5:30  [place] [phone] & [phone]) [facility] [address] [place], [place] [address] [phone] Mon-Thur 8:00-6:00 Accepting Medicaid [place] [address] Madelaine Bhat [place], [place] [address] [phone] Mon-Thur 7:30-7:30,  Fri 7:30-4:30 Accepting [facility] at [place]. [address] [place], [place]  [address] [phone]   Fax - [place] [phone] & [phone]) [place] at [place] [place], [place] [address] [phone] Mon-Fri 7:00-5:00 [place] [address], [place], [place] [address] [phone] Mon-Fri 8:00-5:00 Accepting Medicaid [place] [address] [place], [place], [place] [address] [phone] Mon-Fri 8:00-5:00 Accepting Medicaid  [place] [phone]) [facility] at [facility] [address] Madelaine Bhat [place], [place] [address] [phone] Mon-Fri 8:00-5:00 [place] ([place] at [facility]) [address] [place], [place], [place] [address] [phone] Mon-Fri 8:00-5:00 Accepting Medicaid [place] ([facility] [employer]) [address] [place], [place], [place] [address] [phone] Mon-Fri 8:00-5:30, Sat&Sun by appointment (phones open at 8:30) Accepting [facility] [phone] & [phone]) [place] [address] [place], [place] [address] [phone] Mon-Thur 8:00-7:00, Fri 8:00-5:00, Sat 8:00-12:00, Sun 9:00-12:00 Accepting Medicaid [facility] at [facility] [address] [place], [place], [place] [address] [phone] Mon-Thur 8:00-5:00 Accepting Medicaid [facility] at [place]., [place], [place] [address] [phone] Mon-Fri 8:00-5:30, Sat (Oct.-Mar.) 9:00-1:00 Accepting [facility]  [place] [phone]) [place] [address] [place], [place], [place] [address] [phone] Mon-Fri 8:00-5:00 Accepting [facility]   [place] [phone]) [place] at [facility] [address], [place], [place] [address] [phone] Mon-Fri 8:00-5:00 [place] at [facility] [address] Marolyn Hammock [place], [place] [address] [phone] Mon-Fri 8:00-5:00 [place]. [address], [place],  [address] [phone] Mon-Fri 8:00-5:00 After hours clinic [facility] [place],  [address]) [phone] Mon-Fri 5:00-8:00, Sat 12:00-6:00, Sun 10:00-4:00 Accepting Medicaid  [place] at [facility] [address], [place], [place]  [address] [phone]   Fax - [phone]  [place] [phone]) [place] at [facility] [address] [place], [place], [place] [address] [phone] Mon-Fri 8:00-5:00 [place] ([place] at [place]) [address], [place], [place] [address] [phone] Mon-Thur 8:00-7:00, Fri 8:00-5:00, Sat 8:00-12:00

## 2022-06-09 LAB — CYTOLOGY - PAP
Comment: NEGATIVE
Diagnosis: NEGATIVE
Diagnosis: REACTIVE
High risk HPV: NEGATIVE

## 2022-06-30 ENCOUNTER — Ambulatory Visit: Payer: BC Managed Care – PPO | Attending: Cardiovascular Disease | Admitting: Cardiology

## 2022-06-30 VITALS — Wt 281.0 lb

## 2022-06-30 DIAGNOSIS — I10 Essential (primary) hypertension: Secondary | ICD-10-CM

## 2022-06-30 NOTE — Progress Notes (Signed)
Cardio-Obstetrics Clinic  Follow Up Note   Date:  07/02/2022   ID:  Susan Sellers, DOB 02-05-1991, MRN [id number]  PCP:  Patient, No Pcp Per   [place] Providers Cardiologist:  Berniece Salines, DO  Electrophysiologist:  None       She is at home. I am in the office.   Obtained 2 identifiers prior to visit start.   Referring MD: No ref. provider found   Chief Complaint: " I am doing ok"  History of Present Illness:    Susan Sellers is a 32 y.o. female [id number] who returns for follow up of hypertension. Sje   Since I saw the patient she had delivered her baby. She is doing well.   Prior CV Studies Reviewed: The following studies were reviewed today:   Past Medical History:  Diagnosis Date   Alpha thalassemia silent carrier 10/13/2021   Hypertension     Past Surgical History:  Procedure Laterality Date   CERVICAL CERCLAGE N/A 11/24/2021   Procedure: CERCLAGE CERVICAL;  Surgeon: Mora Bellman, MD;  Location: [facility] LD ORS;  Service: Gynecology;  Laterality: N/A;   CERVICAL CERCLAGE N/A 12/12/2021   Procedure: CERCLAGE CERVICAL;  Surgeon: Tama High, MD;  Location: [facility] LD ORS;  Service: Gynecology;  Laterality: N/A;   WISDOM TOOTH EXTRACTION        OB History     Gravida  4   Para  1   Term  1   Preterm  0   AB  3   Living  1      SAB  2   IAB  0   Ectopic  0   Multiple  0   Live Births  1               Current Medications: Current Meds  Medication Sig   hydrochlorothiazide (HYDRODIURIL) 25 MG tablet Take 1 tablet (25 mg total) by mouth daily.   ibuprofen (ADVIL) 600 MG tablet Take 1 tablet (600 mg total) by mouth every 6 (six) hours as needed for mild pain or cramping.   Prenatal Vit-Fe Fumarate-FA (PRENATAL PO) Take 1 tablet by mouth daily.     Allergies:   Patient has no known allergies.   Social History   Socioeconomic History   Marital status: Single    Spouse name: Not on file   Number of children: Not on  file   Years of education: Not on file   Highest education level: Not on file  Occupational History   Not on file  Tobacco Use   Smoking status: Never   Smokeless tobacco: Never  Vaping Use   Vaping Use: Never used  Substance and Sexual Activity   Alcohol use: Not Currently    Alcohol/week: 0.0 standard drinks of alcohol   Drug use: Not Currently    Types: Marijuana    Comment: last used Nov 2022   Sexual activity: Not Currently    Partners: Male    Birth control/protection: None  Other Topics Concern   Not on file  Social History Narrative   Not on file   Social Determinants of Health   Financial Resource Strain: Not on file  Food Insecurity: No Food Insecurity (04/09/2022)   Hunger Vital Sign    Worried About Running Out of Food in the Last Year: Never true    Ran Out of Food in the Last Year: Never true  Transportation Needs: No Transportation Needs (04/09/2022)   PRAPARE - Transportation  Lack of Transportation (Medical): No    Lack of Transportation (Non-Medical): No  Physical Activity: Not on file  Stress: Not on file  Social Connections: Not on file      Family History  Problem Relation Age of Onset   Heart disease Mother    Hypertension Mother    Hypertension Maternal Grandmother    Hyperlipidemia Paternal Grandmother    Kidney failure Paternal Grandmother       ROS:   Please see the history of present illness.     All other systems reviewed and are negative.   Labs/EKG Reviewed:    EKG:   EKG is was not  ordered today.    Recent Labs: 09/30/2021: ALT 9; BUN 5; Creatinine, Ser 0.62; Potassium 3.6; Sodium 135; TSH 0.681 04/09/2022: Hemoglobin 9.5; Platelets 322   Recent Lipid Panel No results found for: "CHOL", "TRIG", "HDL", "CHOLHDL", "LDLCALC", "LDLDIRECT"  Physical Exam:    VS:  Wt 127.5 kg   BMI 42.10 kg/m     Wt Readings from Last 3 Encounters:  06/30/22 127.5 kg  06/01/22 119.7 kg  04/26/22 123.4 kg        Risk  Assessment/Risk Calculators:                 ASSESSMENT & PLAN:    Hypertension - she is doing well. No complaints today Continue her HCTZ - she will take bp in 2 weeks and send me update.   Fu in 16 weeks.  Total time spent 14 mins.   Patient Instructions  Medication Instructions:  Your physician recommends that you continue on your current medications as directed. Please refer to the Current Medication list given to you today.  Please take your blood pressure daily for 2 weeks and send in a MyChart message. Please include heart rates. If your blood pressure is greater then 140/90 for two days in a row please contact my office.   HOW TO TAKE YOUR BLOOD PRESSURE: Rest 5 minutes before taking your blood pressure. Don't smoke or drink caffeinated beverages for at least 30 minutes before. Take your blood pressure before (not after) you eat. Sit comfortably with your back supported and both feet on the floor (don't cross your legs). Elevate your arm to heart level on a table or a desk. Use the proper sized cuff. It should fit smoothly and snugly around your bare upper arm. There should be enough room to slip a fingertip under the cuff. The bottom edge of the cuff should be 1 inch above the crease of the elbow. Ideally, take 3 measurements at one sitting and record the average.  *If you need a refill on your cardiac medications before your next appointment, please call your pharmacy*   Lab Work: None   Testing/Procedures: None   Follow-Up: At [facility], you and your health needs are our priority.  As part of our continuing mission to provide you with exceptional heart care, we have created designated Provider Care Teams.  These Care Teams include your primary Cardiologist (physician) and Advanced Practice Providers (APPs -  Physician Assistants and Nurse Practitioners) who all work together to provide you with the care you need, when you need it.  Your next  appointment:   16 week(s)  Provider:   Berniece Salines, DO     Dispo:  No follow-ups on file.   Medication Adjustments/Labs and Tests Ordered: Current medicines are reviewed at length with the patient today.  Concerns regarding medicines are outlined  above.  Tests Ordered: No orders of the defined types were placed in this encounter.  Medication Changes: No orders of the defined types were placed in this encounter.

## 2022-06-30 NOTE — Patient Instructions (Addendum)
Medication Instructions:  Your physician recommends that you continue on your current medications as directed. Please refer to the Current Medication list given to you today.  Please take your blood pressure daily for 2 weeks and send in a MyChart message. Please include heart rates. If your blood pressure is greater then 140/90 for two days in a row please contact my office.   HOW TO TAKE YOUR BLOOD PRESSURE: Rest 5 minutes before taking your blood pressure. Don't smoke or drink caffeinated beverages for at least 30 minutes before. Take your blood pressure before (not after) you eat. Sit comfortably with your back supported and both feet on the floor (don't cross your legs). Elevate your arm to heart level on a table or a desk. Use the proper sized cuff. It should fit smoothly and snugly around your bare upper arm. There should be enough room to slip a fingertip under the cuff. The bottom edge of the cuff should be 1 inch above the crease of the elbow. Ideally, take 3 measurements at one sitting and record the average.  *If you need a refill on your cardiac medications before your next appointment, please call your pharmacy*   Lab Work: None   Testing/Procedures: None   Follow-Up: At [facility], you and your health needs are our priority.  As part of our continuing mission to provide you with exceptional heart care, we have created designated Provider Care Teams.  These Care Teams include your primary Cardiologist (physician) and Advanced Practice Providers (APPs -  Physician Assistants and Nurse Practitioners) who all work together to provide you with the care you need, when you need it.  Your next appointment:   16 week(s)  Provider:   Berniece Salines, DO

## 2022-09-12 IMAGING — US US PELVIS LIMITED
1 series · 16 of 25 positions shown · non-contrast
Comparison: None.

CLINICAL DATA: Rule out retained products of conception. Recent
delivery.

EXAM:
LIMITED ULTRASOUND OF PELVIS
TECHNIQUE: Limited transabdominal ultrasound examination of the pelvis was
performed.

[Series 1: us pelvis limited · 42 acquisitions, 16 frames shown]
[im 1/42]
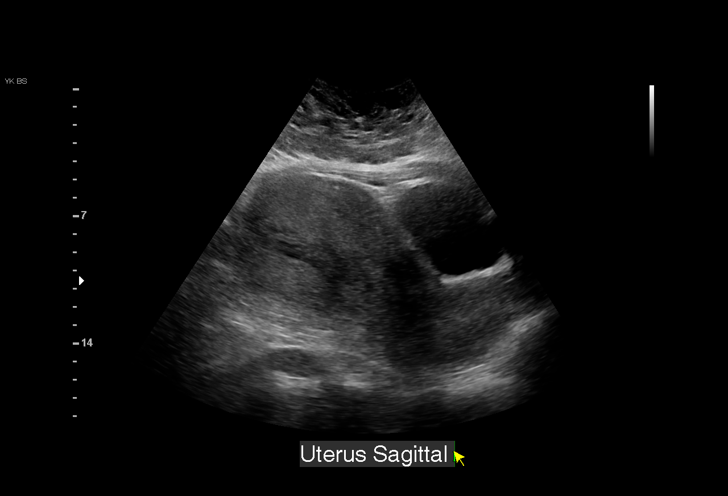
[im 4/42]
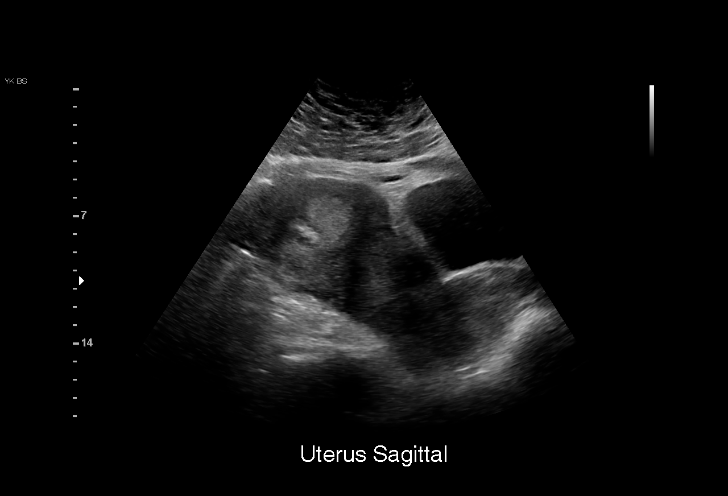
[im 6/42]
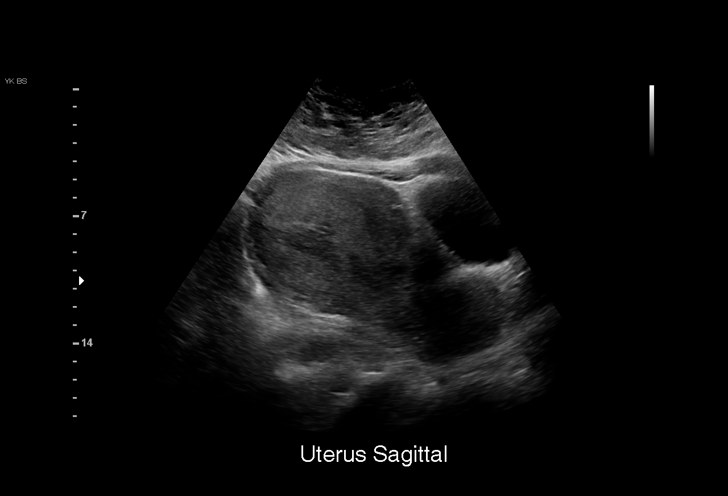
[im 9/42]
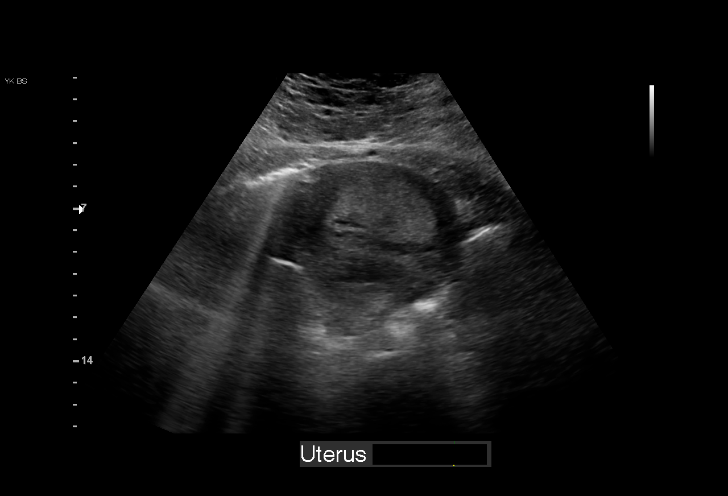
[im 12/42]
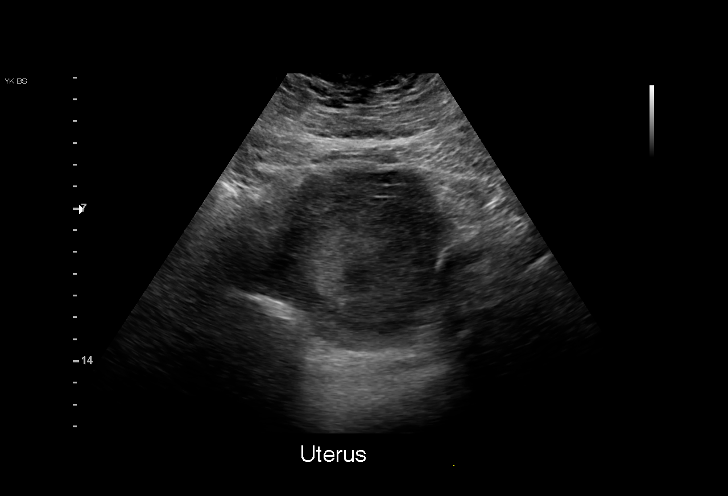
[im 14/42]
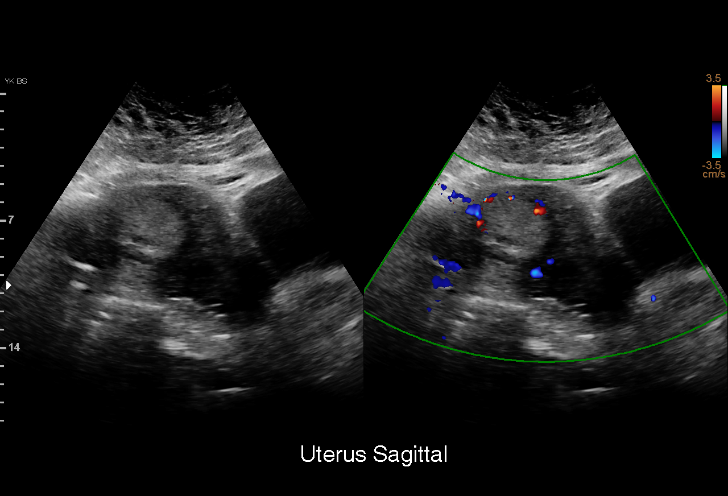
[im 18/42]
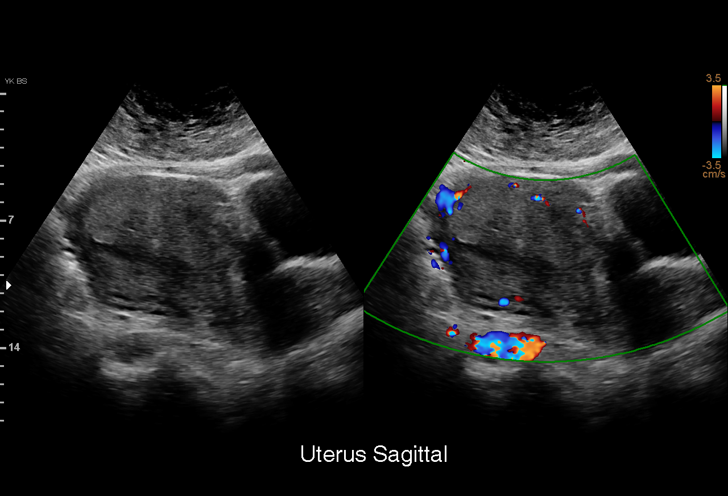
[im 19/42]
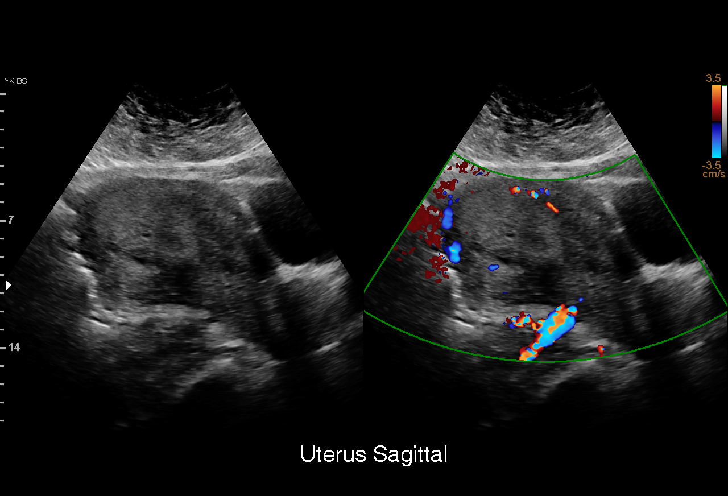
[im 23/42]
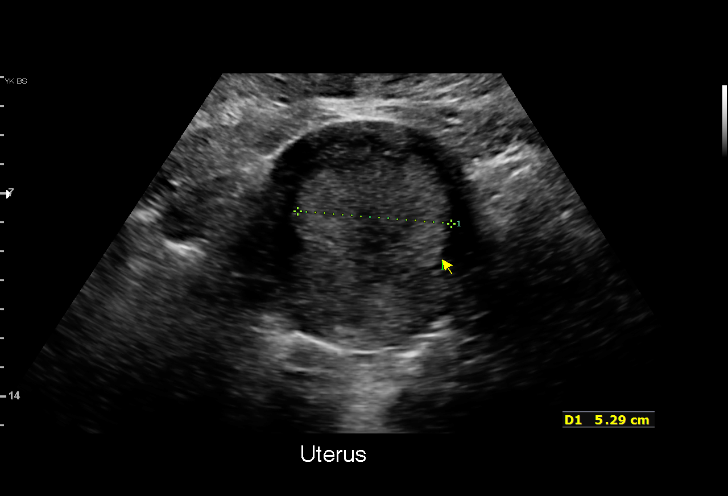
[im 24/42]
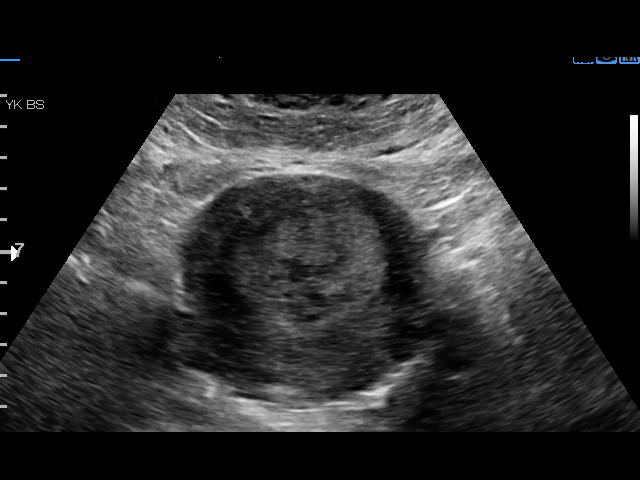
[im 28/42]
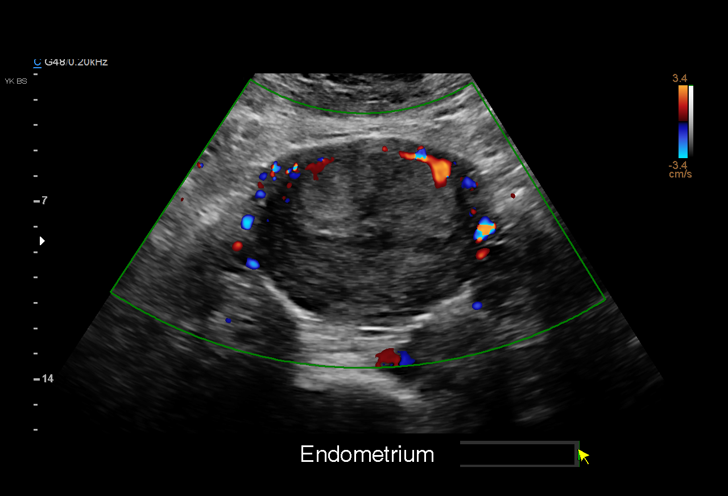
[im 30/42]
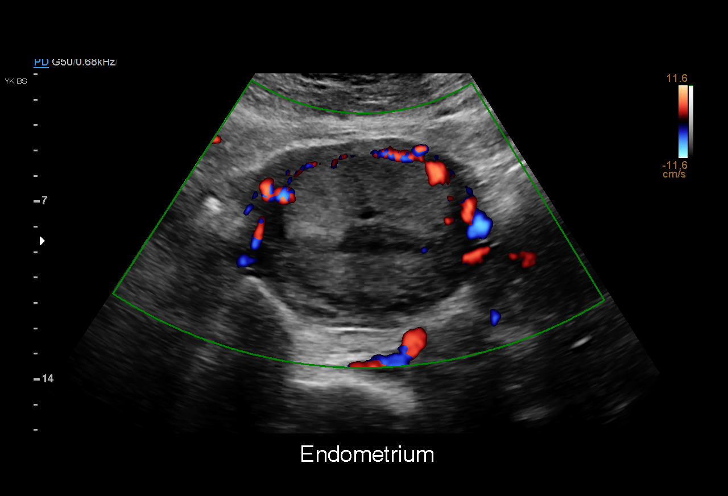
[im 33/42]
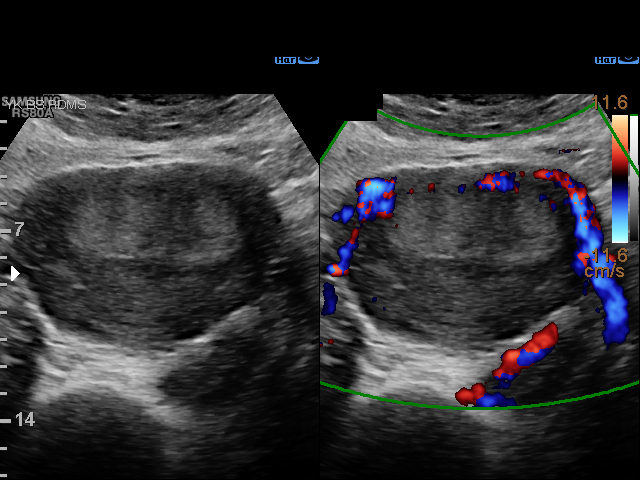
[im 36/42]
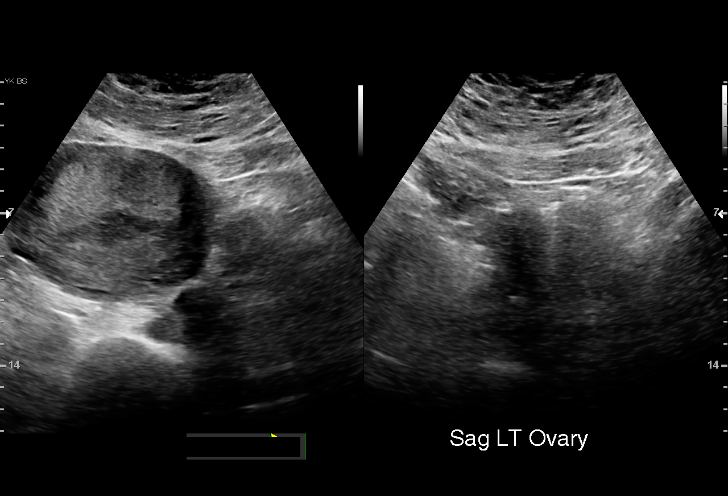
[im 38/42]
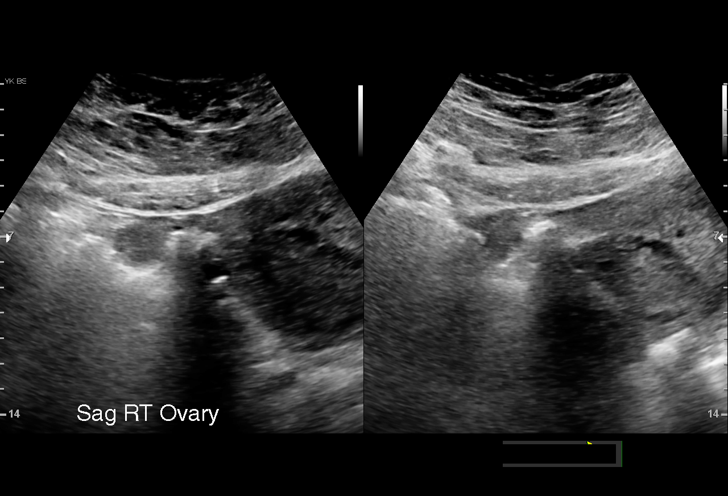
[im 42/42]
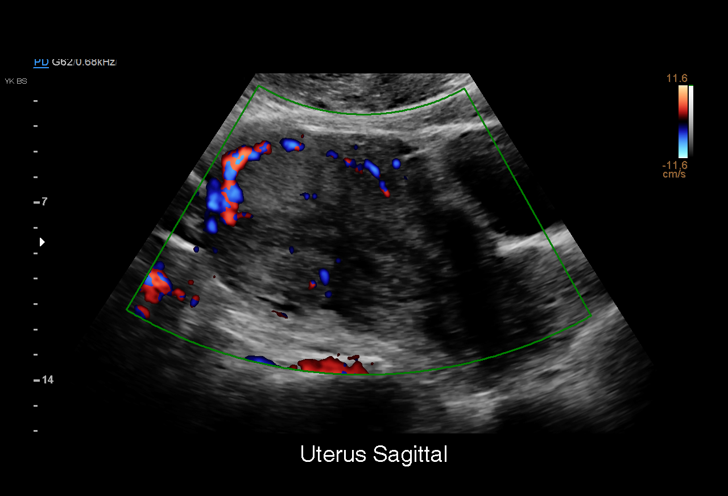

[16 of 25 positions shown; findings below may reference images not displayed]

FINDINGS: Uterus measures 16 x 8 x 9 cm with a volume of 523 cc.

The endometrial stripe thickness is 54.3 cm. A rounded hyperechoic
masslike region with peripheral blood flow measures 6 x 3.6 x
cm.

The ovaries are normal in appearance.  No free fluid.
IMPRESSION: 1. The findings are consistent with retained products of conception.

## 2022-11-25 ENCOUNTER — Telehealth: Payer: Self-pay

## 2022-11-25 NOTE — Telephone Encounter (Signed)
Attempted to reach patient regarding disability paperwork - no answer.

## 2022-11-26 ENCOUNTER — Encounter: Payer: Self-pay | Admitting: Obstetrics & Gynecology

## 2023-03-30 ENCOUNTER — Ambulatory Visit
Admission: EM | Admit: 2023-03-30 | Discharge: 2023-03-30 | Disposition: A | Discharge status: Home / Self Care | Payer: BC Managed Care – PPO | Attending: Physician Assistant | Admitting: Physician Assistant

## 2023-03-30 DIAGNOSIS — J029 Acute pharyngitis, unspecified: Secondary | ICD-10-CM | POA: Diagnosis not present

## 2023-03-30 LAB — POCT RAPID STREP A (OFFICE): Rapid Strep A Screen: NEGATIVE

## 2023-03-30 NOTE — Discharge Instructions (Addendum)
Return if any problems.

## 2023-03-30 NOTE — ED Triage Notes (Signed)
"  I am having congestion in my nose that I have had for a while, not runny nose, but starting yesterday I am having a sore throat and swollen tonsils on left side mainly and having a hoarse voice, hard to swallow due to pain". No fever. No new/unexplained rash.

## 2023-04-01 ENCOUNTER — Telehealth: Payer: Self-pay

## 2023-04-01 LAB — CULTURE, GROUP A STREP (THRC)

## 2023-04-01 NOTE — Telephone Encounter (Signed)
Patient requesting additional information as to why antibiotics were not prescribed for her sore throat. Explained that her discharge information was related to viral infection. Instructed patient to alternate tylenol and ibuprofen for comfort and should symptoms worsen, she is free to be rechecked should she desire.

## 2023-04-01 NOTE — ED Provider Notes (Signed)
[facility]    CSN: [id number] Arrival date & time: 03/30/23  0850      History   Chief Complaint Chief Complaint  Patient presents with   Sore Throat    HPI Susan Sellers is a 32 y.o. female.   Pt complains of swollen tonsils.  Pt complains of cough and congestion.    The history is provided by the patient. No language interpreter was used.  Sore Throat This is a new problem. The problem occurs constantly. The problem has not changed since onset.Nothing aggravates the symptoms. Nothing relieves the symptoms. She has tried nothing for the symptoms.    Past Medical History:  Diagnosis Date   Alpha thalassemia silent carrier 10/13/2021   Hypertension     Patient Active Problem List   Diagnosis Date Noted   Abnormal cervical Papanicolaou smear on 02/02/2021, normal colposcopy 09/30/2021   Hypertension 04/11/2021    Past Surgical History:  Procedure Laterality Date   CERVICAL CERCLAGE N/A 11/24/2021   Procedure: CERCLAGE CERVICAL;  Surgeon: Catalina Antigua, MD;  Location: [facility]S;  Service: Gynecology;  Laterality: N/A;   CERVICAL CERCLAGE N/A 12/12/2021   Procedure: CERCLAGE CERVICAL;  Surgeon: Noralee Space, MD;  Location: [facility];  Service: Gynecology;  Laterality: N/A;   WISDOM TOOTH EXTRACTION      OB History     Gravida  4   Para  1   Term  1   Preterm  0   AB  3   Living  1      SAB  2   IAB  0   Ectopic  0   Multiple  0   Live Births  1            Home Medications    Prior to Admission medications   Medication Sig Start Date End Date Taking? Authorizing Provider  hydrochlorothiazide (HYDRODIURIL) 25 MG tablet Take 1 tablet (25 mg total) by mouth daily. 04/19/22   Constant, Peggy, MD  ibuprofen (ADVIL) 600 MG tablet Take 1 tablet (600 mg total) by mouth every 6 (six) hours as needed for mild pain or cramping. 04/11/22   Donette Larry, CNM  Prenatal Vit-Fe Fumarate-FA (PRENATAL PO) Take 1 tablet by mouth daily.     [provider]    Family History Family History  Problem Relation Age of Onset   Heart disease Mother    Hypertension Mother    Hypertension Maternal Grandmother    Hyperlipidemia Paternal Grandmother    Kidney failure Paternal Grandmother     Social History Social History   Tobacco Use   Smoking status: Never   Smokeless tobacco: Never  Vaping Use   Vaping status: Never Used  Substance Use Topics   Alcohol use: Not Currently    Alcohol/week: 0.0 standard drinks of alcohol   Drug use: Not Currently    Types: Marijuana    Comment: last used Nov 2022     Allergies   Patient has no known allergies.   Review of Systems Review of Systems  HENT:  Positive for sore throat.   All other systems reviewed and are negative.    Physical Exam Triage Vital Signs ED Triage Vitals  Encounter Vitals Group     BP 03/30/23 0908 123/86     Systolic BP Percentile --      Diastolic BP Percentile --      Pulse Rate 03/30/23 0908 84     Resp 03/30/23 0908 18  Temp 03/30/23 0908 98.5 F (36.9 C)     Temp Source 03/30/23 0908 Oral     SpO2 03/30/23 0908 97 %     Weight 03/30/23 0906 (!) 310 lb (140.6 kg)     Height 03/30/23 0906 5\' 8"  (1.727 m)     Head Circumference --      Peak Flow --      Pain Score 03/30/23 0903 5     Pain Loc --      Pain Education --      Exclude from Growth Chart --    No data found.  Updated Vital Signs BP 123/86 (BP Location: Left Arm)   Pulse 84   Temp 98.5 F (36.9 C) (Oral)   Resp 18   Ht 5\' 8"  (1.727 m)   Wt (!) 140.6 kg   LMP 03/02/2023 (Exact Date)   SpO2 97%   Breastfeeding No   BMI 47.14 kg/m   Visual Acuity Right Eye Distance:   Left Eye Distance:   Bilateral Distance:    Right Eye Near:   Left Eye Near:    Bilateral Near:     Physical Exam Vitals reviewed.  Constitutional:      Appearance: She is well-developed.  HENT:     Right Ear: Tympanic membrane normal.     Left Ear: Tympanic membrane normal.      Mouth/Throat:     Pharynx: Posterior oropharyngeal erythema present.  Cardiovascular:     Rate and Rhythm: Normal rate.  Pulmonary:     Breath sounds: Normal breath sounds.  Abdominal:     Palpations: Abdomen is soft.  Skin:    General: Skin is warm.  Neurological:     General: No focal deficit present.     Mental Status: She is alert.  Psychiatric:        Mood and Affect: Mood normal.      UC Treatments / Results  Labs (all labs ordered are listed, but only abnormal results are displayed) Labs Reviewed  POCT RAPID STREP A (OFFICE) - Normal  CULTURE, GROUP A STREP [facility])    EKG   Radiology No results found.  Procedures Procedures (including critical care time)  Medications Ordered in UC Medications - No data to display  Initial Impression / Assessment and Plan / UC Course  I have reviewed the triage vital signs and the nursing notes.  Pertinent labs & imaging results that were available during my care of the patient were reviewed by me and considered in my medical decision making (see chart for details).     Strep negative  culture ordered.  Final Clinical Impressions(s) / UC Diagnoses   Final diagnoses:  Acute pharyngitis, unspecified etiology     Discharge Instructions      Return if any problems.      ED Prescriptions   None    PDMP not reviewed this encounter. An After Visit Summary was printed and given to the patient.       Elson Areas, [place] 04/01/23 2221

## 2023-04-02 ENCOUNTER — Telehealth: Payer: Self-pay

## 2023-04-02 MED ORDER — PENICILLIN V POTASSIUM 500 MG PO TABS: 500.0000 mg | ORAL_TABLET | Freq: Two times a day (BID) | ORAL | 0 refills | Status: AC

## 2024-06-12 ENCOUNTER — Ambulatory Visit
Admission: EM | Admit: 2024-06-12 | Discharge: 2024-06-12 | Disposition: A | Discharge status: Home / Self Care | Attending: Physician Assistant | Admitting: Physician Assistant

## 2024-06-12 ENCOUNTER — Encounter: Payer: Self-pay | Admitting: Emergency Medicine

## 2024-06-12 DIAGNOSIS — K649 Unspecified hemorrhoids: Secondary | ICD-10-CM

## 2024-06-12 MED ORDER — DOCUSATE SODIUM 100 MG PO CAPS: 100.0000 mg | ORAL_CAPSULE | Freq: Two times a day (BID) | ORAL | 2 refills | Status: AC

## 2024-06-12 MED ORDER — HYDROCORTISONE ACETATE 25 MG RE SUPP: 25.0000 mg | Freq: Two times a day (BID) | RECTAL | 1 refills | Status: AC

## 2024-06-12 MED ORDER — HYDROCORTISONE (PERIANAL) 2.5 % EX CREA: 1.0000 | TOPICAL_CREAM | Freq: Two times a day (BID) | CUTANEOUS | 0 refills | Status: AC

## 2024-06-12 NOTE — ED Triage Notes (Addendum)
 Patient presents for hemorrhoids rupture x 1 day.   Patient has no pain unless she sits on it.  Patient think it was an abscess on it. Patient took ibphofen this am.

## 2024-06-12 NOTE — ED Provider Notes (Signed)
 " [facility]    CSN: [id number] Arrival date & time: 06/12/24  1432      History   Chief Complaint Chief Complaint  Patient presents with   Hemorrhoids    HPI Susan Sellers is a 34 y.o. female.   Patient complains of bleeding from hemorrhoids.  Patient reports that she has had problems with hemorrhoids since the birth of her child.  Patient states that she has been soaking the area and swelling has gone down some.  Patient noticed some bleeding.  Patient denies any fever or chills.  The history is provided by the patient. No language interpreter was used.    Past Medical History:  Diagnosis Date   Alpha thalassemia silent carrier 10/13/2021   Hypertension     Patient Active Problem List   Diagnosis Date Noted   Abnormal cervical Papanicolaou smear on 02/02/2021, normal colposcopy 09/30/2021   Hypertension 04/11/2021    Past Surgical History:  Procedure Laterality Date   CERVICAL CERCLAGE N/A 11/24/2021   Procedure: CERCLAGE CERVICAL;  Surgeon: Alger Gong, MD;  Location: [facility];  Service: Gynecology;  Laterality: N/A;   CERVICAL CERCLAGE N/A 12/12/2021   Procedure: CERCLAGE CERVICAL;  Surgeon: Arna Ranks, MD;  Location: [facility];  Service: Gynecology;  Laterality: N/A;   WISDOM TOOTH EXTRACTION      OB History     Gravida  4   Para  1   Term  1   Preterm  0   AB  3   Living  1      SAB  2   IAB  0   Ectopic  0   Multiple  0   Live Births  1            Home Medications    Prior to Admission medications  Medication Sig Start Date End Date Taking? Authorizing Provider  docusate sodium  (COLACE) 100 MG capsule Take 1 capsule (100 mg total) by mouth 2 (two) times daily. 06/12/24 06/12/25 Yes Cebert Dettmann K, PA-C  hydrocortisone  (ANUSOL -HC) 2.5 % rectal cream Apply 1 Application topically 2 (two) times daily. 06/12/24  Yes Emil Klassen K, PA-C  hydrocortisone  (ANUSOL -HC) 25 MG suppository Place 1 suppository (25 mg total)  rectally every 12 (twelve) hours. 06/12/24 06/12/25 Yes Flint Sonny POUR, PA-C  hydrochlorothiazide  (HYDRODIURIL ) 25 MG tablet Take 1 tablet (25 mg total) by mouth daily. 04/19/22   Constant, Peggy, MD  ibuprofen  (ADVIL ) 600 MG tablet Take 1 tablet (600 mg total) by mouth every 6 (six) hours as needed for mild pain or cramping. 04/11/22   Sung Hollering, CNM  Prenatal Vit-Fe Fumarate-FA (PRENATAL PO) Take 1 tablet by mouth daily.    [provider]    Family History Family History  Problem Relation Age of Onset   Heart disease Mother    Hypertension Mother    Hypertension Maternal Grandmother    Hyperlipidemia Paternal Grandmother    Kidney failure Paternal Grandmother     Social History Social History[1]   Allergies   Patient has no known allergies.   Review of Systems Review of Systems  All other systems reviewed and are negative.    Physical Exam Triage Vital Signs ED Triage Vitals  Encounter Vitals Group     BP 06/12/24 1616 (!) 131/93     Girls Systolic BP Percentile --      Girls Diastolic BP Percentile --      Boys Systolic BP Percentile --  Boys Diastolic BP Percentile --      Pulse Rate 06/12/24 1616 77     Resp 06/12/24 1616 18     Temp 06/12/24 1616 99.5 F (37.5 C)     Temp Source 06/12/24 1616 Oral     SpO2 06/12/24 1616 97 %     Weight --      Height --      Head Circumference --      Peak Flow --      Pain Score 06/12/24 1614 3     Pain Loc --      Pain Education --      Exclude from Growth Chart --    No data found.  Updated Vital Signs BP (!) 131/93 (BP Location: Left Arm)   Pulse 77   Temp 99.5 F (37.5 C) (Oral)   Resp 18   SpO2 97%   Visual Acuity Right Eye Distance:   Left Eye Distance:   Bilateral Distance:    Right Eye Near:   Left Eye Near:    Bilateral Near:     Physical Exam Vitals and nursing note reviewed.  Constitutional:      Appearance: She is well-developed.  HENT:     Head: Normocephalic.   Cardiovascular:     Rate and Rhythm: Normal rate.  Pulmonary:     Effort: Pulmonary effort is normal.  Abdominal:     General: There is no distension.  Genitourinary:    Comments: External rectal hemorrhoids, Musculoskeletal:        General: Normal range of motion.  Skin:    General: Skin is warm.  Neurological:     General: No focal deficit present.     Mental Status: She is alert and oriented to person, place, and time.      UC Treatments / Results  Labs (all labs ordered are listed, but only abnormal results are displayed) Labs Reviewed - No data to display  EKG   Radiology No results found.  Procedures Procedures (including critical care time)  Medications Ordered in UC Medications - No data to display  Initial Impression / Assessment and Plan / UC Course  I have reviewed the triage vital signs and the nursing notes.  Pertinent labs & imaging results that were available during my care of the patient were reviewed by me and considered in my medical decision making (see chart for details).      Final Clinical Impressions(s) / UC Diagnoses   Final diagnoses:  Hemorrhoids, unspecified hemorrhoid type     Discharge Instructions      Soak area 20 minutes 4 times a day   ED Prescriptions     Medication Sig Dispense Auth. Provider   docusate sodium  (COLACE) 100 MG capsule Take 1 capsule (100 mg total) by mouth 2 (two) times daily. 60 capsule Miakoda Mcmillion K, PA-C   hydrocortisone  (ANUSOL -HC) 2.5 % rectal cream Apply 1 Application topically 2 (two) times daily. 30 g Majestic Molony K, PA-C   hydrocortisone  (ANUSOL -HC) 25 MG suppository Place 1 suppository (25 mg total) rectally every 12 (twelve) hours. 12 suppository Camielle Sizer K, PA-C      PDMP not reviewed this encounter. An After Visit Summary was printed and given to the patient.    An After Visit Summary was printed and given to the patient.     [1]  Social History Tobacco Use   Smoking  status: Never   Smokeless tobacco: Never  Vaping Use   Vaping  status: Never Used  Substance Use Topics   Alcohol use: Not Currently    Alcohol/week: 0.0 standard drinks of alcohol   Drug use: Not Currently    Types: Marijuana    Comment: last used Nov 2022     [place] Sonny POUR, PA-C 06/12/24 1644  "

## 2024-06-12 NOTE — Discharge Instructions (Addendum)
 Soak area 20 minutes 4 times a day
